# Patient Record
Sex: Female | Born: 1990 | Race: White | Hispanic: No | Marital: Single | Smoking: Current every day smoker
Health system: Southern US, Community
[De-identification: ages and names within clinical notes are randomized; demographics above are authoritative.]

## PROBLEM LIST (undated history)

## (undated) DIAGNOSIS — F329 Major depressive disorder, single episode, unspecified: Secondary | ICD-10-CM

## (undated) DIAGNOSIS — F32A Depression, unspecified: Secondary | ICD-10-CM

## (undated) DIAGNOSIS — F419 Anxiety disorder, unspecified: Secondary | ICD-10-CM

---
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
## 2019-08-17 ENCOUNTER — Inpatient Hospital Stay (HOSPITAL_COMMUNITY)
Admission: AD | Admit: 2019-08-17 | Discharge: 2019-08-21 | DRG: 881 | Disposition: A | Discharge status: Home / Self Care | Payer: Medicaid Other | Source: Intra-hospital | Attending: Psychiatry | Admitting: Psychiatry

## 2019-08-17 ENCOUNTER — Encounter (HOSPITAL_COMMUNITY): Payer: Self-pay | Admitting: Behavioral Health

## 2019-08-17 ENCOUNTER — Other Ambulatory Visit: Payer: Self-pay

## 2019-08-17 DIAGNOSIS — E0590 Thyrotoxicosis, unspecified without thyrotoxic crisis or storm: Secondary | ICD-10-CM

## 2019-08-17 DIAGNOSIS — G4700 Insomnia, unspecified: Secondary | ICD-10-CM | POA: Diagnosis present

## 2019-08-17 DIAGNOSIS — F332 Major depressive disorder, recurrent severe without psychotic features: Secondary | ICD-10-CM | POA: Diagnosis not present

## 2019-08-17 DIAGNOSIS — F419 Anxiety disorder, unspecified: Secondary | ICD-10-CM | POA: Diagnosis present

## 2019-08-17 DIAGNOSIS — F1520 Other stimulant dependence, uncomplicated: Secondary | ICD-10-CM | POA: Diagnosis not present

## 2019-08-17 DIAGNOSIS — F1010 Alcohol abuse, uncomplicated: Secondary | ICD-10-CM | POA: Diagnosis present

## 2019-08-17 DIAGNOSIS — F1994 Other psychoactive substance use, unspecified with psychoactive substance-induced mood disorder: Secondary | ICD-10-CM

## 2019-08-17 DIAGNOSIS — F329 Major depressive disorder, single episode, unspecified: Principal | ICD-10-CM | POA: Diagnosis present

## 2019-08-17 DIAGNOSIS — R899 Unspecified abnormal finding in specimens from other organs, systems and tissues: Secondary | ICD-10-CM

## 2019-08-17 DIAGNOSIS — F17210 Nicotine dependence, cigarettes, uncomplicated: Secondary | ICD-10-CM | POA: Diagnosis present

## 2019-08-17 HISTORY — DX: Anxiety disorder, unspecified: F41.9

## 2019-08-17 HISTORY — DX: Depression, unspecified: F32.A

## 2019-08-17 MED ORDER — MAGNESIUM HYDROXIDE 400 MG/5ML PO SUSP: 30.0000 mL | Freq: Every day | ORAL | Status: DC | PRN

## 2019-08-17 MED ORDER — ACETAMINOPHEN 325 MG PO TABS
650.0000 mg | ORAL_TABLET | Freq: Four times a day (QID) | ORAL | Status: DC | PRN
  Administered 2019-08-20: 08:00:00 650 mg via ORAL
  Filled 2019-08-17: qty 2

## 2019-08-17 MED ORDER — TRAZODONE HCL 50 MG PO TABS
50.0000 mg | ORAL_TABLET | Freq: Every evening | ORAL | Status: DC | PRN
  Administered 2019-08-17 – 2019-08-20 (×3): 50 mg via ORAL
  Filled 2019-08-17 (×3): qty 1

## 2019-08-17 MED ORDER — HYDROXYZINE HCL 25 MG PO TABS
25.0000 mg | ORAL_TABLET | Freq: Four times a day (QID) | ORAL | Status: DC | PRN
  Administered 2019-08-17: 22:00:00 25 mg via ORAL
  Filled 2019-08-17: qty 1

## 2019-08-17 MED ORDER — ALUM & MAG HYDROXIDE-SIMETH 200-200-20 MG/5ML PO SUSP: 30.0000 mL | ORAL | Status: DC | PRN

## 2019-08-17 NOTE — Tx Team (Signed)
Initial Treatment Plan 08/17/2019 6:30 PM Malaak Stach [id number]    PATIENT STRESSORS: Financial difficulties Legal issue Loss of employment Marital or family conflict   PATIENT STRENGTHS: Ability for insight Average or above average intelligence Communication skills   PATIENT IDENTIFIED PROBLEMS: depression  anxiety  Substance abuse  Financial difficulties               DISCHARGE CRITERIA:  Ability to meet basic life and health needs Improved stabilization in mood, thinking, and/or behavior Motivation to continue treatment in a less acute level of care Verbal commitment to aftercare and medication compliance  PRELIMINARY DISCHARGE PLAN: Outpatient therapy Participate in family therapy Return to previous living arrangement  PATIENT/FAMILY INVOLVEMENT: This treatment plan has been presented to and reviewed with the patient, Norina Buzzard.  The patient has been given the opportunity to ask questions and make suggestions.  Ronelle Nigh, RN 08/17/2019, 6:30 PM

## 2019-08-17 NOTE — Progress Notes (Signed)
Psychoeducational Group Note  Date:  08/17/2019 Time:2030  Group Topic/Focus:  wrap up group  Participation Level: Did Not Attend  Participation Quality:  Not Applicable  Affect:  Not Applicable  Cognitive:  Not Applicable  Insight:  Not Applicable  Engagement in Group: Not Applicable  Additional Comments:  Pt was notified that group was beginning but remained in her room, showered, and finished signing admission papers.   Erica Lee 08/17/2019, 9:16 PM
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
## 2019-08-18 DIAGNOSIS — F332 Major depressive disorder, recurrent severe without psychotic features: Secondary | ICD-10-CM

## 2019-08-18 DIAGNOSIS — F1994 Other psychoactive substance use, unspecified with psychoactive substance-induced mood disorder: Secondary | ICD-10-CM

## 2019-08-18 DIAGNOSIS — F1520 Other stimulant dependence, uncomplicated: Secondary | ICD-10-CM

## 2019-08-18 LAB — TSH: TSH: 0.159 u[IU]/mL — ABNORMAL LOW (ref 0.350–4.500)

## 2019-08-18 MED ORDER — ADULT MULTIVITAMIN W/MINERALS CH
1.0000 | ORAL_TABLET | Freq: Every day | ORAL | Status: DC
  Administered 2019-08-18 – 2019-08-21 (×4): 1 via ORAL
  Filled 2019-08-18 (×7): qty 1

## 2019-08-18 MED ORDER — ENSURE ENLIVE PO LIQD
237.0000 mL | Freq: Two times a day (BID) | ORAL | Status: DC
  Administered 2019-08-18 – 2019-08-21 (×4): 237 mL via ORAL

## 2019-08-18 MED ORDER — HYDROXYZINE HCL 25 MG PO TABS
25.0000 mg | ORAL_TABLET | Freq: Four times a day (QID) | ORAL | Status: DC | PRN
  Administered 2019-08-18 – 2019-08-20 (×3): 25 mg via ORAL
  Filled 2019-08-18 (×3): qty 1

## 2019-08-18 MED ORDER — ONDANSETRON 4 MG PO TBDP
4.0000 mg | ORAL_TABLET | Freq: Four times a day (QID) | ORAL | Status: DC | PRN
Start: 2019-08-18 — End: 2019-08-18

## 2019-08-18 MED ORDER — LORAZEPAM 1 MG PO TABS
1.0000 mg | ORAL_TABLET | Freq: Four times a day (QID) | ORAL | Status: DC | PRN
  Filled 2019-08-18: qty 1

## 2019-08-18 MED ORDER — SERTRALINE HCL 50 MG PO TABS
50.0000 mg | ORAL_TABLET | Freq: Every day | ORAL | Status: DC
  Administered 2019-08-18 – 2019-08-21 (×4): 50 mg via ORAL
  Filled 2019-08-18 (×7): qty 1

## 2019-08-18 MED ORDER — LOPERAMIDE HCL 2 MG PO CAPS: 2.0000 mg | ORAL_CAPSULE | ORAL | Status: DC | PRN

## 2019-08-18 MED ORDER — VITAMIN B-1 100 MG PO TABS
100.0000 mg | ORAL_TABLET | Freq: Every day | ORAL | Status: DC
  Administered 2019-08-19 – 2019-08-21 (×3): 100 mg via ORAL
  Filled 2019-08-18 (×6): qty 1

## 2019-08-18 MED ORDER — THIAMINE HCL 100 MG/ML IJ SOLN
100.0000 mg | Freq: Once | INTRAMUSCULAR | Status: AC
  Administered 2019-08-18: 100 mg via INTRAMUSCULAR
  Filled 2019-08-18: qty 2

## 2019-08-18 NOTE — Progress Notes (Signed)
Patient signed 72 hr request for discharge at 1505 today

## 2019-08-18 NOTE — [facility] Group Notes (Signed)
[facility] LCSW Group Therapy Note  Date/Time:  08/18/2019  10:00AM-11:00PM  Type of Therapy and Topic:  Group Therapy:  Music's Effect on Depression and Anxiety  Participation Level:  None   Description of Group: In this process group, members discussed what types of music trigger them to worsening mental health symptoms and what they can do about this in the future.  For instance, we discussed what to do when riding in a friend's car and a song harmful to the patient starts playing.  We also discussed how music can be used as a tool to help with wellness and recovery in various ways including managing depression and anxiety as well as encouraging healthy sleep habits and avoiding relapse into old negative behaviors such as drinking, self-harming, or staying in bed all day.  Five songs were played as examples, including "Tell Your Heart To Beat Again," "Inward Journey," "Stand," "I Am Enough," and "The Fight Song."  Comments were elicited which showed the group to be in agreement that the songs were quite relatable and have the potential to help them outside of the hospital setting.  Therapeutic Goals: 1. Patients will be introduced to several specific songs that can relate to self-image and self-love in a helpful way. 2. Patients will explore the impact of different pieces of music on their feelings, i.e. uplifting, triggering, etc. 3. Patients will discuss how to use this self-knowledge to assist in recovery.  Summary of Patient Progress:  At the beginning of group, patient refused to answer questions, and she never volunteered any comments later in group, although she did stay present for the entirety of group.  Therapeutic Modalities: Solution Focused Brief Therapy Activity   Selmer Dominion, LCSW

## 2019-08-18 NOTE — Progress Notes (Signed)
D.  Pt continues to be very anxious and agitated about being here.  Pt did not attend evening wrap up group, observed tearful on phone with family.  Pt did agree to sign admission paperwork.  Pt in dayroom or on phone until she requested her bedtime medications and went to bed.  Pt denies SI/HI/AVH at this time.  A.  Support and encouragement offered, medication given as ordered  R.  Pt remains safe on the unit, will continue to monitor.

## 2019-08-18 NOTE — Progress Notes (Signed)
NUTRITION ASSESSMENT  Pt identified as at risk on the Malnutrition Screen Tool  INTERVENTION: 1. Supplements: Ensure Enlive po BID, each supplement provides 350 kcal and 20 grams of protein  NUTRITION DIAGNOSIS: Unintentional weight loss related to sub-optimal intake as evidenced by pt report.   Goal: Pt to meet >/= 90% of their estimated nutrition needs.  Monitor:  PO intake  Assessment:  Pt admitted for depression and substance abuse (meth, ETOH). Pt reports drinking 4-6 beers on the weekends. Per weight records in care everywhere, pt has lost 18 lbs since 8/18 (11% wt loss x 3 months, significant for time frame). Will order Ensure supplements.  Height: Ht Readings from Last 1 Encounters:  08/17/19 5\' 2"  (1.575 m)    Weight: Wt Readings from Last 1 Encounters:  08/17/19 64.1 kg    Weight Hx: Wt Readings from Last 10 Encounters:  08/17/19 64.1 kg    BMI:  Body mass index is 25.84 kg/m. Pt meets criteria for overweight based on current BMI.  Estimated Nutritional Needs: Kcal: 25-30 kcal/kg Protein: > 1 gram protein/kg Fluid: 1 ml/kcal  Diet Order:  Diet Order    None     Pt is also offered choice of unit snacks mid-morning and mid-afternoon.  Pt is eating as desired.   Lab results and medications reviewed.   Clayton Bibles, MS, RD, LDN Inpatient Clinical Dietitian Pager: [phone] After Hours Pager: [phone]

## 2019-08-18 NOTE — Progress Notes (Signed)
D. Pt presents as depressed, anxious and mildly  Irritable. Pt observed attending group led by SW.Marland Kitchen Per pt's self inventory, pt rated her depression, hopelessness and anxiety all 10's. Pt writes that her goal today is "go home to my house with my kids", and writes that she will "stay clean, get a job, I don't know", to help her meet her goal.  Pt currently denies SI/HI and AVH  A. Labs and vitals monitored. Pt supported emotionally and encouraged to express concerns and ask questions.   R. Pt remains safe with 15 minute checks. Will continue POC.

## 2019-08-18 NOTE — [facility] Suicide Risk Assessment (Signed)
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  World Pharmacologist [facility]). Score between 0-7:  no or low risk or alcohol related problems. Score between 8-15:  moderate risk of alcohol related problems. Score between 16-19:  high risk of alcohol related problems. Score 20 or above:  warrants further diagnostic evaluation for alcohol dependence and treatment.   CLINICAL FACTORS:  28 year old female, presented to [facility] ED via police.  Presents as fair historian.  States she has not been "feeling right" recently and endorses worsening depression, neurovegetative symptoms.  Denies suicidal ideations.  No psychotic symptoms are noted or reported.  Reports recent explosive behaviors (recently broke a mirror, destroyed a TV at home) which she states occurred in the context of explosive anger related to relationship stress/argument.  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  She also reports drinking several times a week up to 6 high alcohol content beers per episode.  Currently does not present with symptoms of alcohol withdrawal and her vitals are stable at this time.   Psychiatric Specialty Exam: Physical Exam  ROS  Blood pressure 118/60, pulse 72, temperature  97.8 F (36.6 C), temperature source Oral, resp. rate 16, height 5\' 2"  (1.575 m), weight 64.1 kg, SpO2 100 %.Body mass index is 25.84 kg/m.  See admit note MSE   COGNITIVE FEATURES THAT CONTRIBUTE TO RISK:  Closed-mindedness and Loss of executive function    SUICIDE RISK:   Moderate:  Frequent suicidal ideation with limited intensity, and duration, some specificity in terms of plans, no associated intent, good self-control, limited dysphoria/symptomatology, some risk factors present, and identifiable protective factors, including available and accessible social support.  PLAN OF CARE: Patient will be admitted to inpatient psychiatric unit for stabilization and safety. Will provide and encourage milieu participation. Provide medication management and maked adjustments as needed.  Will also provide medication management to address alcohol withdrawal if needed -will follow daily.    I certify that inpatient services furnished can reasonably be expected to improve the patient's condition.   Jenne Campus, MD 08/18/2019, 3:20 PM

## 2019-08-18 NOTE — Progress Notes (Signed)
Felt NOVEL CORONAVIRUS (COVID-19) DAILY CHECK-OFF SYMPTOMS - answer yes or no to each - every day NO YES  Have you had a fever in the past 24 hours?  . Fever (Temp > 37.80C / 100F) X   Have you had any of these symptoms in the past 24 hours? . New Cough .  Sore Throat  .  Shortness of Breath .  Difficulty Breathing .  Unexplained Body Aches   X   Have you had any one of these symptoms in the past 24 hours not related to allergies?   . Runny Nose .  Nasal Congestion .  Sneezing   X   If you have had runny nose, nasal congestion, sneezing in the past 24 hours, has it worsened?  X   EXPOSURES - check yes or no X   Have you traveled outside the state in the past 14 days?  X   Have you been in contact with someone with a confirmed diagnosis of COVID-19 or PUI in the past 14 days without wearing appropriate PPE?  X   Have you been living in the same home as a person with confirmed diagnosis of COVID-19 or a PUI (household contact)?    X   Have you been diagnosed with COVID-19?    X              What to do next: Answered NO to all: Answered YES to anything:   Proceed with unit schedule Follow the [facility] Inpatient Flowsheet.   

## 2019-08-18 NOTE — H&P (Signed)
Psychiatric Admission Assessment Adult  Patient Identification: Erica Lee MRN:  [id number] Date of Evaluation:  08/18/2019 Chief Complaint: " I guess I told my mother I use meth" Principal Diagnosis: MDD versus Substance Induced Mood Disorder, Methamphetamine/ Alcohol Use Disorder Diagnosis: Endorses depression and some neuro-vegetative symptoms as below.    History of Present Illness:  28 year old female, presented to [facility] ED via police. States she told her parents to call the police " because I was not feeling right".  Presents as fair historian, vaguely irritable during session. Reports methamphetamine use disorder and states she has been using 2-3 x per week. Has also been drinking on most days of week, up to 6 ( high alcohol content ) beers per episode. She also describes significant stressors in addition to substance/alcohol abuse:financial difficulties forcing her to ask family members for money, " so it makes me feel like a loser ". Also,  her youngest daughter required psychiatric admission several weeks ago, which has caused her to feel she failed her as a mother , " I feel it was  my fault " Endorses depression, which she states she does not think is directly related to methamphetamine use, and explains that has significantly cut down on frequency and amount use. States " I am depressed even when I don't use it ". Describes some neuro-vegetative symptoms as below. Denies having suicidal ideations . Denies psychotic symptoms, and currently does not present paranoid or internally preoccupied, no delusions are expressed . States she recently broke a Ship brokermirror and a TV at home. Denies suicidal or self injurious intent, states she was angry with her boyfriend.  No current symptoms of WDL- no tremors, no diaphoresis, no restlessness or agitation. BP 118/60, pulse 72.  Associated Signs/Symptoms: Depression Symptoms:  depressed mood, anhedonia, hypersomnia, loss of  energy/fatigue, decreased appetite, (Hypo) Manic Symptoms:  irritability Anxiety Symptoms:  Reports chronic but worsening anxiety, states she worries " about everything" Psychotic Symptoms:  Denies hallucinations, no delusions are currently expressed  PTSD Symptoms: States " I have been through a lot of sh..." but does not elaborate, reports she does have intrusive memories and " thinks about it a lot", denies nightmares or other PTSD symptoms at this time Total Time spent with patient: 45 minutes  Past Psychiatric History:  Denies prior psychiatric admissions. Reports history of chronic depression. Denies history of suicide attempts, denies history of self cutting or of self injurious behaviors. 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   Is the patient at risk to self? Yes.    Has the patient been a risk to self in the past 6 months? No.  Has the patient been a risk to self within the distant past? No.  Is the patient a risk to others? No.  Has the patient been a risk to others in the past 6 months? No.  Has the patient been a risk to others within the distant past? No.   Prior Inpatient Therapy: Prior Inpatient Therapy: No Prior Outpatient Therapy: Prior Outpatient Therapy: No Does patient have an ACCT team?: No Does patient have Intensive In-House Services?  : No Does patient have [facility] services? : No Does patient have P4CC services?: No  Alcohol Screening: 1. How often do you have a drink containing  alcohol?: 2 to 4 times a month 2. How many drinks containing alcohol do you have on a typical day when you are drinking?: 3 or 4 3. How often do you have six or more drinks on one occasion?: Less than monthly AUDIT-C  Score: 4 4. How often during the last year have you found that you were not able to stop drinking once you had started?: Less than monthly 5. How often during the last year have you failed to do what was normally expected from you becasue of drinking?: Less than monthly 6. How often during the last year have you needed a first drink in the morning to get yourself going after a heavy drinking session?: Less than monthly 7. How often during the last year have you had a feeling of guilt of remorse after drinking?: Monthly 8. How often during the last year have you been unable to remember what happened the night before because you had been drinking?: Less than monthly 9. Have you or someone else been injured as a result of your drinking?: No 10. Has a relative or friend or a doctor or another health worker been concerned about your drinking or suggested you cut down?: No Alcohol Use Disorder Identification Test Final Score (AUDIT): 10 Substance Abuse History in the last 12 months:  Reports history of methamphetamine use disorder, has been using 2-3 x week,has also been drinking 4-6 high alcohol content beer on most days of week. States she last drank on Thanksgiving Day.  Consequences of Substance Abuse: Denies history of WDL seizures, denies history of blackouts, denies history of DUIs. 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  Previous Psychotropic Medications: was not taking any medications prior to admission. Reports she was prescribed Prozac 3-4 years ago, but was not well tolerated  Psychological Evaluations: No  Past Medical History: Denies medical illnesses. NKDA.  Past Medical History:  Diagnosis Date  . Anxiety   . Depression    History reviewed. No pertinent surgical history. Family History: Parents alive , live together, has one brother  Family Psychiatric  History: Describes  history of substance abuse in family members and states " I think everybody has Bipolar  Disorder". No suicides in family .  Tobacco Screening:  smokes 1 PPD Social History: 28, single, lives with boyfriend,  has 4 children ( oldest is 19, youngest is 2), who are currently with patient's mother and boyfriend. Unemployed. Social History   Substance and Sexual Activity  Alcohol Use Yes   Comment: 4-6 cans of beer on weekends     Social History   Substance and Sexual Activity  Drug Use Yes  . Types: Methamphetamines, Marijuana   Comment: patient is evasive concerning use of methamphetamines    Additional Social History: Marital status: Single    Pain Medications: see MAR Prescriptions: See MAR Over the Counter: see MAR History of alcohol / drug use?: Yes Longest period of sobriety (when/how long): unknown Negative Consequences of Use: Financial, Personal relationships, Work / Programmer, multimedia Name of Substance 1: methamphetamine 1 - Age of First Use: UTA 1 - Amount (size/oz): UTA 1 - Frequency: Denies current use, but UDS positive for methamphetamine 1 - Duration: UTA 1 - Last Use / Amount: UTA Name of Substance 2: Marijuana 2 - Age of First Use: UTA 2 - Amount (size/oz): UTA 2 - Frequency: UTA 2 - Duration: UTA 2 - Last Use / Amount: UTA Name of Substance 3: alcohol 3 - Age of First Use: UTA 3 -  Amount (size/oz): 4-6 cans of beer 3 - Frequency: weekends 3 - Duration: UTA 3 - Last Use / Amount: UTA              Allergies:  No Known Allergies Lab Results: No results found for this or any previous visit (from the past 48 hour(s)).  Blood Alcohol level:  No results found for: [facility]  Metabolic Disorder Labs:  No results found for: HGBA1C, MPG No results found for: PROLACTIN No results found for: CHOL, TRIG, HDL, CHOLHDL, VLDL, LDLCALC  Current Medications: Current Facility-Administered Medications  Medication Dose Route Frequency Provider Last Rate Last Dose  . acetaminophen (TYLENOL) tablet 650 mg  650 mg Oral Q6H PRN Derrill Center, NP      . alum & mag  hydroxide-simeth (MAALOX/MYLANTA) 200-200-20 MG/5ML suspension 30 mL  30 mL Oral Q4H PRN Derrill Center, NP      . hydrOXYzine (ATARAX/VISTARIL) tablet 25 mg  25 mg Oral Q6H PRN Lindon Romp A, NP   25 mg at 08/17/19 2148  . magnesium hydroxide (MILK OF MAGNESIA) suspension 30 mL  30 mL Oral Daily PRN Derrill Center, NP      . traZODone (DESYREL) tablet 50 mg  50 mg Oral QHS PRN Derrill Center, NP   50 mg at 08/17/19 2148   PTA Medications: No medications prior to admission.    Musculoskeletal: Strength & Muscle Tone: within normal limits Gait & Station: normal Patient leans: N/A  Psychiatric Specialty Exam: Physical Exam  Review of Systems  Constitutional: Negative.  Negative for chills and fever.  HENT: Negative.   Eyes: Negative.   Respiratory: Negative for cough.   Cardiovascular: Negative for chest pain.  Gastrointestinal: Negative for diarrhea, nausea and vomiting.  Genitourinary: Negative.   Musculoskeletal: Negative for myalgias.  Skin: Negative.  Negative for rash.  Neurological: Negative.  Negative for seizures and headaches.  Endo/Heme/Allergies: Negative.   Psychiatric/Behavioral: Positive for depression and substance abuse.    Blood pressure 118/60, pulse 72, temperature 97.8 F (36.6 C), temperature source Oral, resp. rate 16, height 5\' 2"  (1.575 m), weight 64.1 kg, SpO2 100 %.Body mass index is 25.84 kg/m.  General Appearance: Fairly Groomed  Eye Contact:  Fair  Speech:  Normal Rate  Volume:  Normal  Mood:  Depressed and Dysphoric  Affect:  congruent, constricted and vaguely irritable  Thought Process:  Linear  Orientation:  Other:  fully alert and attentive  Thought Content:  no hallucinations, no delusions, not internally preoccupied   Suicidal Thoughts:  No denies suicidal or self injurious ideations and states " I would never hurt myself, I have my kids to think of "  Homicidal Thoughts:  No  Memory:  recent and remote grossly intact   Judgement:   Fair  Insight:  Fair  Psychomotor Activity:  Normal currently not presenting with tremors or diaphoresis. No restlessness or psychomotor agitation noted at this time  Concentration:  Concentration: Good and Attention Span: Good  Recall:  Good  Fund of Knowledge:  Good  Language:  Good  Akathisia:  Negative  Handed:  Right  AIMS (if indicated):     Assets:  Communication Skills Desire for Improvement Resilience  ADL's:  Intact  Cognition:  WNL  Sleep:  Number of Hours: 5.75    Treatment Plan Summary: Daily contact with patient to assess and evaluate symptoms and progress in treatment, Medication management, Plan inpatient treatment  and medications as below  Observation Level/Precautions:  15 minute  checks  Laboratory:  I reviewed labs from [facility] ED - WBC, BMP unremarkable ( K+ 3.4)  UDS (+) for amphetamine, no BAL ,urine pregnancy negative, RPR and COVID negative.   Psychotherapy:  Milieu, group therapy   Medications:  We discussed options, agrees to antidepressant trial. Start Zoloft 50 mgrs QDAY. Will start Ativan PRN for alcohol WDL as per CIWA protocol if needed .  Consultations:  as needed   Discharge Concerns:  -  Estimated LOS: 3- 4 days  Other:     Physician Treatment Plan for Primary Diagnosis: Depression Long Term Goal(s): Improvement in symptoms so as ready for discharge  Short Term Goals: Ability to identify changes in lifestyle to reduce recurrence of condition will improve, Ability to verbalize feelings will improve, Ability to disclose and discuss suicidal ideas, Ability to demonstrate self-control will improve, Ability to identify and develop effective coping behaviors will improve, Ability to maintain clinical measurements within normal limits will improve and Compliance with prescribed medications will improve  Physician Treatment Plan for Secondary Diagnosis: Amphetamine, Alcohol Use Disorder, substance induced mood disorder Long Term Goal(s): Improvement in  symptoms so as ready for discharge  Short Term Goals: Ability to identify changes in lifestyle to reduce recurrence of condition will improve, Ability to verbalize feelings will improve, Ability to disclose and discuss suicidal ideas, Ability to demonstrate self-control will improve, Ability to identify and develop effective coping behaviors will improve, Ability to maintain clinical measurements within normal limits will improve and Compliance with prescribed medications will improve  I certify that inpatient services furnished can reasonably be expected to improve the patient's condition.    Craige Cotta, MD 11/29/202011:21 AM

## 2019-08-18 NOTE — [facility] Counselor (Signed)
Adult Comprehensive Assessment  Patient ID: Erica Lee, female   DOB: 1990/12/13, 28 y.o.   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 Family Relationships: "Everybody stresses me." Financial / Lack of resources (include bankruptcy): "All the time." Housing / Lack of housing: "It's not my house.  It's stressful all the time." Physical health (include injuries & life threatening diseases): Will not answer Social relationships: WIll not answer Substance abuse: "Always" Bereavement / Loss: "All the time"  Living/Environment/Situation:  Living Arrangements: Children, Spouse/significant other Living conditions (as described by patient or guardian): Good Who else lives in the home?: Boyfriend and 3 of her children (the other lives with her parents) How long has patient lived in current situation?: Does not know What is atmosphere in current home: Supportive  Family History:  Marital status: Long term relationship Long term relationship, how long?: 4 years What types of issues is patient dealing with in the relationship?: "Just that I don't have a job." Additional relationship information: During assessment, patient makes the statement that she should probably go live with parents so she can stay away from drugs. Does patient have children?: Yes How many children?: 4 How is patient's relationship with their children?: 2yo, 6yo, 8yo, and 13yo - does not answer what her relationship is like with them, although they all love her and she loves them.  Mother and father have all the children while patient is in the hospital.  Day to day, the  30yo lives with her parents and the three younger children live with her.  Childhood History:  By whom was/is the patient raised?: Both parents, Grandparents Description of patient's relationship with caregiver when they were a child: Does not remember, they worked all the time. Patient's description of current relationship with people who raised him/her: Good with both How were you disciplined when you got in trouble as a child/adolescent?: Grounded Does patient have siblings?: Yes Number of Siblings: 1 Description of patient's current relationship with siblings: Brother - good relationship Did patient suffer any verbal/emotional/physical/sexual abuse as a child?: No Did patient suffer from severe childhood neglect?: No Has patient ever been sexually abused/assaulted/raped as an adolescent or adult?: No Was the patient ever a victim of a crime or a disaster?: No Witnessed domestic violence?: No Has patient been effected by domestic violence as an adult?: No  Education:  Highest grade of school patient has completed: 9th grade Currently a student?: No Learning disability?: No  Employment/Work Situation:   Employment situation: Unemployed What is the longest time patient has a held a job?: 1 year 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 Did You Receive Any Psychiatric Treatment/Services While in Equities trader?: (No Financial planner) Are There Guns or Other Weapons in Your Home?: No  Financial Resources:   Surveyor, quantity resources: Support from parents / caregiver, Medicaid Does patient have a Lawyer or guardian?: No  Alcohol/Substance Abuse:   What has been your use of drugs/alcohol within the last 12 months?: Methamphetamines 1-2 times a month; Marijuana if somebody brings to her; Alcohol 4-6 cans of beer on weekends Alcohol/Substance Abuse Treatment Hx: Denies past history Has alcohol/substance abuse ever caused legal problems?:  No  Social Support  System:   Heritage manager System: Manufacturing engineer System: Boyfriend, whole family Type of faith/religion: None How does patient's faith help to cope with current illness?: N/A  Leisure/Recreation:   Leisure and Hobbies: "I don't know."  Strengths/Needs:   What is the patient's perception of their strengths?: Cooking, being a good momma Patient states they can use these personal strengths during their treatment to contribute to their recovery: Not sure Patient states these barriers may affect/interfere with their treatment: None Patient states these barriers may affect their return to the community: None Other important information patient would like considered in planning for their treatment: None  Discharge Plan:   Currently receiving community mental health services: No Patient states concerns and preferences for aftercare planning are: "I will do anything to get out of here and be with my young'uns." Patient states they will know when they are safe and ready for discharge when: "I wish I knew that answer."  Keeps insisting she wants to go home now. Does patient have access to transportation?: Yes Does patient have financial barriers related to discharge medications?: No Patient description of barriers related to discharge medications: Has support of loved ones and medicaid Will patient be returning to same living situation after discharge?: Yes(Going to her home, although she states that she should probably go stay with her parents where she cannot use drugs.)  Summary/Recommendations:   Summary and Recommendations (to be completed by the evaluator): Patient is a 28yo female admitted after law enforcement was called to the house, stating "I guess I'm depressed or have drug problems or something."  She reports using methamphetamines 1-2 times a month, marijuana "when it's brought to me," and alcohol on weekends.  Primary stressors include financial problems,  being unable to find a job, arguing with her mother about the welfare of her 4 children, and an upcoming court date for intentionally damaging someone's car.  She has recently broken a mirror with her hand, destroyed a television.  She reports having auditory/visual hallucinations only when under the influence of methamphetamines.  She denies ever having mental health or substance abuse treatment.  Mother states she often appears confused and has made suicidal threats.  Patient is hyperfocused and tearful about leaving hospital immediately.  She signed a 72-hour discharge request on 11/29 at 3:05pm.  Patient will benefit from crisis stabilization, medication evaluation, group therapy and psychoeducation, in addition to case management for discharge planning. At discharge it is recommended that Patient adhere to the established discharge plan and continue in treatment.  Maretta Los. 08/18/2019

## 2019-08-19 NOTE — Progress Notes (Signed)
Adult Psychoeducational Group Note  Date:  08/19/2019 Time:  9:04 AM  Group Topic/Focus:  Wrap-Up Group:   The focus of this group is to help patients review their daily goal of treatment and discuss progress on daily workbooks.  Participation Level:  Minimal  Participation Quality:  Appropriate  Affect:  Appropriate  Cognitive:  Appropriate  Insight: Appropriate  Engagement in Group:  Improving  Modes of Intervention:  Discussion  Additional Comments:  Pt attend wrap up group her day was a 9. Her goal for today was to communicate. She achieve her goal. Her coping skills calling her kids. No questions.  Lenice Llamas Long 08/19/2019, 9:04 AM

## 2019-08-19 NOTE — Progress Notes (Signed)
Psychoeducational Group Note  Date:  08/19/2019 Time:  2212  Group Topic/Focus:  Wrap-Up Group:   The focus of this group is to help patients review their daily goal of treatment and discuss progress on daily workbooks.  Participation Level: Did Not Attend  Participation Quality:  Not Applicable  Affect:  Not Applicable  Cognitive:  Not Applicable  Insight:  Not Applicable  Engagement in Group: Not Applicable  Additional Comments: The patient did not attend group this evening.   Sherley Mckenney S 08/19/2019, 10:12 PM

## 2019-08-19 NOTE — Progress Notes (Signed)
Recreation Therapy Notes  Date:  11.30.20 Time: 0930 Location: [address] Dayroom  Group Topic: Stress Management  Goal Area(s) Addresses:  Patient will identify positive stress management techniques. Patient will identify benefits of using stress management post d/c.  Intervention: Stress Management  Activity : Meditation.  LRT played a meditation that focused on making choices.  Patients were to listen and follow along as meditation played to engage in activity.  Education:  Stress Management, Discharge Planning.   Education Outcome: Acknowledges Education  Clinical Observations/Feedback:  Pt did not attend group.    Victorino Sparrow, LRT/CTRS         Victorino Sparrow A 08/19/2019 11:16 AM

## 2019-08-19 NOTE — [facility] Group Notes (Signed)
LCSW Group Therapy Note 08/19/2019 2:29 PM  Type of Therapy and Topic: Group Therapy: Overcoming Obstacles  Participation Level: Did Not Attend  Description of Group:  In this group patients will be encouraged to explore what they see as obstacles to their own wellness and recovery. They will be guided to discuss their thoughts, feelings, and behaviors related to these obstacles. The group will process together ways to cope with barriers, with attention given to specific choices patients can make. Each patient will be challenged to identify changes they are motivated to make in order to overcome their obstacles. This group will be process-oriented, with patients participating in exploration of their own experiences as well as giving and receiving support and challenge from other group members.  Therapeutic Goals: 1. Patient will identify personal and current obstacles as they relate to admission. 2. Patient will identify barriers that currently interfere with their wellness or overcoming obstacles.  3. Patient will identify feelings, thought process and behaviors related to these barriers. 4. Patient will identify two changes they are willing to make to overcome these obstacles:   Summary of Patient Progress  Invited, chose not to attend.    Therapeutic Modalities:  Cognitive Behavioral Therapy Solution Focused Therapy Motivational Interviewing Relapse Prevention Therapy   Theresa Duty Clinical Social Worker

## 2019-08-19 NOTE — [facility] Suicide Risk Assessment (Signed)
[place] INPATIENT:  Family/Significant Other Suicide Prevention Education  Suicide Prevention Education:  Contact Attempts: Vanity Larsson, mother, [phone]) has been identified by the patient as the family member/significant other with whom the patient will be residing, and identified as the person(s) who will aid the patient in the event of a mental health crisis.  With written consent from the patient, two attempts were made to provide suicide prevention education, prior to and/or following the patient's discharge.  We were unsuccessful in providing suicide prevention education.  A suicide education pamphlet was given to the patient to share with family/significant other.  Date and time of first attempt: 08/19/2019 at 3:10PM Date and time of second attempt: Second attempt is needed  CSW notes that HIPAA compliant voicemail was left.  Rozann Lesches 08/19/2019, 3:09 PM

## 2019-08-19 NOTE — Progress Notes (Signed)
D.  Pt denies complaints on approach, forwards very little.  Pt is slow to answer questions when asked directly.  Pt was positive for evening wrap up group, minimal interaction on the unit.  Pt denies SI/HI/AVH at this time.  A.  Support and encouragement offered, medication given as ordered  R.  Pt remains safe on the unit, will continue to monitor.

## 2019-08-19 NOTE — Progress Notes (Signed)
Spiritual care group on grief and loss facilitated by chaplain Jerene Pitch MDiv, [facility]  Group Goal:  Support / Education around grief and loss Members engage in facilitated group support and psycho-social education.  Group Description:  Following introductions and group rules, group members engaged in facilitated group dialog and support around topic of loss, with particular support around experiences of loss in their lives. Group Identified types of loss (relationships / self / things) and identified patterns, circumstances, and changes that precipitate losses. Reflected on thoughts / feelings around loss, normalized grief responses, and recognized variety in grief experience.   Group noted Worden's four tasks of grief in discussion.  Group drew on Adlerian / Rogerian, narrative, MI, Patient Progress:  Present throughout group.  Attentive to group conversation.  Did not engage in group discussion.

## 2019-08-19 NOTE — [facility] Group Notes (Signed)
LCSW Group Therapy Note 08/19/2019 2:30 PM  Type of Therapy and Topic: Group Therapy: Overcoming Obstacles  Participation Level: Did Not Attend  Description of Group:  In this group patients will be encouraged to explore what they see as obstacles to their own wellness and recovery. They will be guided to discuss their thoughts, feelings, and behaviors related to these obstacles. The group will process together ways to cope with barriers, with attention given to specific choices patients can make. Each patient will be challenged to identify changes they are motivated to make in order to overcome their obstacles. This group will be process-oriented, with patients participating in exploration of their own experiences as well as giving and receiving support and challenge from other group members.  Therapeutic Goals: 1. Patient will identify personal and current obstacles as they relate to admission. 2. Patient will identify barriers that currently interfere with their wellness or overcoming obstacles.  3. Patient will identify feelings, thought process and behaviors related to these barriers. 4. Patient will identify two changes they are willing to make to overcome these obstacles:   Summary of Patient Progress  Invited, chose not to attend.    Therapeutic Modalities:  Cognitive Behavioral Therapy Solution Focused Therapy Motivational Interviewing Relapse Prevention Therapy   Theresa Duty Clinical Social Worker

## 2019-08-19 NOTE — Progress Notes (Signed)
[facility] MD Progress Note  08/19/2019 6:48 PM Erica Lee  MRN:  [id number] Subjective: Patient reports "I guess I am feeling better".  Today states that she does feel that recent methamphetamine abuse was contributing to her depression and to her explosive behaviors. Currently she is denying suicidal ideations.  Denies psychotic symptoms and does not appear internally preoccupied, no delusions are expressed.  She presents future oriented and hopeful for discharge soon. At this time denies medication side effects. Objective: I have reviewed chart notes/discussed case with treatment team/met with patient. 28 year old female, presented to [facility] ED via police.  Presents as fair historian.  States she has not been "feeling right" recently and endorses worsening depression, neurovegetative symptoms.  Denies suicidal ideations.  No psychotic symptoms are noted or reported.  Reports recent explosive behaviors (recently broke a mirror, destroyed a TV at home) which she states occurred in the context of explosive anger related to relationship stress/argument.  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  She also reports drinking several times a week up to 6 high alcohol content beers per episode.  Currently does not present with symptoms of alcohol withdrawal and her vitals are stable at this time.  Today patient presents alert, attentive, remains vaguely depressed/dysphoric but describes improvement compared to admission and affect does present more reactive and less irritable as session progresses.  Denies hallucinations and does not appear internally preoccupied.  No delusions are expressed.  As above, today expresses that recent methamphetamine use was likely contributing to her mood and to her explosive behaviors. At this time she is denying suicidal ideations. Presents future oriented and is hopeful for discharge soon. No disruptive or agitated behaviors on unit, limited  group/milieu participation thus far.  Tends to isolate in room.  Vaguely guarded but cooperative on approach. Patient is not currently presenting with symptoms of alcohol withdrawal.  There are no tremors, no diaphoresis, no psychomotor restlessness.  BP stable at 107/73, pulse 81. 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 Diagnosis: Active Problems:   MDD (major depressive disorder)  Total Time spent with patient: 15 minutes  Past Psychiatric History:   Past Medical History:  Past Medical History:  Diagnosis Date  . Anxiety   . Depression    History reviewed. No pertinent surgical history. Family History: History reviewed. No pertinent family history. Family Psychiatric  History: Social History:  Social History   Substance and Sexual Activity  Alcohol Use Yes   Comment: 4-6 cans of beer on weekends     Social History   Substance and Sexual Activity  Drug Use Yes  . Types: Methamphetamines, Marijuana   Comment: patient is evasive concerning use of methamphetamines    Social History   Socioeconomic History  . Marital status: Single    Spouse name: Not on file  . Number of children: 4  . Years of education: Not on file  . Highest education level: Not on file  Occupational History  . Not on file  Social Needs  . Financial resource strain: Very hard  . Food insecurity    Worry: Not on file    Inability: Not on file  . Transportation needs    Medical: Not on file    Non-medical: Not on file  Tobacco Use  . Smoking status: Current Every Day Smoker    Packs/day: 0.50  . Smokeless tobacco: Never Used  Substance and Sexual Activity  . Alcohol  use: Yes    Comment: 4-6 cans of beer on weekends  . Drug use: Yes    Types: Methamphetamines, Marijuana    Comment: patient is evasive concerning use of methamphetamines  . Sexual activity: Not on file  Lifestyle  . Physical activity     Days per week: Not on file    Minutes per session: Not on file  . Stress: Not on file  Relationships  . Social Herbalist on phone: Not on file    Gets together: Not on file    Attends religious service: Not on file    Active member of club or organization: Not on file    Attends meetings of clubs or organizations: Not on file    Relationship status: Not on file  Other Topics Concern  . Not on file  Social History Narrative  . Not on file   Additional Social History:    Pain Medications: see MAR Prescriptions: See MAR Over the Counter: see MAR History of alcohol / drug use?: Yes Longest period of sobriety (when/how long): unknown Negative Consequences of Use: Financial, Personal relationships, Work / School Name of Substance 1: methamphetamine 1 - Age of First Use: UTA 1 - Amount (size/oz): UTA 1 - Frequency: Denies current use, but UDS positive for methamphetamine 1 - Duration: UTA 1 - Last Use / Amount: UTA Name of Substance 2: Marijuana 2 - Age of First Use: UTA 2 - Amount (size/oz): UTA 2 - Frequency: UTA 2 - Duration: UTA 2 - Last Use / Amount: UTA Name of Substance 3: alcohol 3 - Age of First Use: UTA 3 - Amount (size/oz): 4-6 cans of beer 3 - Frequency: weekends 3 - Duration: UTA 3 - Last Use / Amount: UTA    Sleep: Fair improving  Appetite:  Improving  Current Medications: Current Facility-Administered Medications  Medication Dose Route Frequency Provider Last Rate Last Dose  . acetaminophen (TYLENOL) tablet 650 mg  650 mg Oral Q6H PRN Derrill Center, NP      . alum & mag hydroxide-simeth (MAALOX/MYLANTA) 200-200-20 MG/5ML suspension 30 mL  30 mL Oral Q4H PRN Derrill Center, NP      . feeding supplement (ENSURE ENLIVE) (ENSURE ENLIVE) liquid 237 mL  237 mL Oral BID BM Shayan Bramhall, Myer Peer, MD   237 mL at 08/19/19 0748  . hydrOXYzine (ATARAX/VISTARIL) tablet 25 mg  25 mg Oral Q6H PRN Jaelan Rasheed, Myer Peer, MD   25 mg at 08/18/19 2108  . LORazepam  (ATIVAN) tablet 1 mg  1 mg Oral Q6H PRN Corby Villasenor A, MD      . magnesium hydroxide (MILK OF MAGNESIA) suspension 30 mL  30 mL Oral Daily PRN Derrill Center, NP      . multivitamin with minerals tablet 1 tablet  1 tablet Oral Daily Elese Rane, Myer Peer, MD   1 tablet at 08/19/19 0747  . sertraline (ZOLOFT) tablet 50 mg  50 mg Oral Daily Nikkia Devoss, Myer Peer, MD   50 mg at 08/19/19 0747  . thiamine (VITAMIN B-1) tablet 100 mg  100 mg Oral Daily Keivon Garden, Myer Peer, MD   100 mg at 08/19/19 0747  . traZODone (DESYREL) tablet 50 mg  50 mg Oral QHS PRN Derrill Center, NP   50 mg at 08/18/19 2108    Lab Results:  Results for orders placed or performed during the hospital encounter of 08/17/19 (from the past 48 hour(s))  TSH     Status:  Abnormal   Collection Time: 08/18/19  6:02 PM  Result Value Ref Range   TSH 0.159 (L) 0.350 - 4.500 uIU/mL    Comment: Performed by a 3rd Generation assay with a functional sensitivity of <=0.01 uIU/mL. Performed at [facility], [place] [address] [place], [place] [address]     Blood Alcohol level:  No results found for: [facility]  Metabolic Disorder Labs: No results found for: HGBA1C, MPG No results found for: PROLACTIN No results found for: CHOL, TRIG, HDL, CHOLHDL, VLDL, LDLCALC  Physical Findings: AIMS: Facial and Oral Movements Muscles of Facial Expression: None, normal Lips and Perioral Area: None, normal Jaw: None, normal Tongue: None, normal,Extremity Movements Upper (arms, wrists, hands, fingers): None, normal Lower (legs, knees, ankles, toes): None, normal, Trunk Movements Neck, shoulders, hips: None, normal, Overall Severity Severity of abnormal movements (highest score from questions above): None, normal Incapacitation due to abnormal movements: None, normal Patient's awareness of abnormal movements (rate only patient's report): No Awareness, Dental Status Current problems with teeth and/or dentures?: No Does patient usually wear  dentures?: No  CIWA:  CIWA-Ar Total: 1 COWS:  COWS Total Score: 0  Musculoskeletal: Strength & Muscle Tone: within normal limits no tremors, no diaphoresis, no psychomotor restlessness Gait & Station: normal Patient leans: N/A  Psychiatric Specialty Exam: Physical Exam  ROS denies chest pain, no shortness of breath, no cough, no vomiting  Blood pressure 107/73, pulse 81, temperature 97.8 F (36.6 C), temperature source Oral, resp. rate 16, height '5\' 2"'  (1.575 m), weight 64.1 kg, SpO2 100 %.Body mass index is 25.84 kg/m.  General Appearance: Fairly Groomed  Eye Contact:  Fair, improves during session  Speech:  Normal Rate  Volume:  Normal  Mood:  Reports feeling better, remains vaguely dysphoric/depressed but affect more reactive/less irritable  Affect:  Partially improved affect, more reactive  Thought Process:  Linear and Descriptions of Associations: Intact  Orientation:  Other:  Fully alert and attentive  Thought Content:  Denies hallucinations, no delusions expressed, not internally preoccupied  Suicidal Thoughts:  No currently denies any suicidal or self-injurious ideations, also denies homicidal or violent ideations  Homicidal Thoughts:  No  Memory:  Recent and remote grossly intact  Judgement:  Fair/improving  Insight:  Fair/improving  Psychomotor Activity:  Normal no psychomotor agitation or restlessness.  No distal tremors or diaphoresis  Concentration:  Concentration: Improving and Attention Span: Improving  Recall:  Good  Fund of Knowledge:  Good  Language:  Good  Akathisia:  Negative  Handed:  Right  AIMS (if indicated):     Assets:  Desire for Improvement Resilience  ADL's:  Intact  Cognition:  WNL  Sleep:  Number of Hours: 6.5   Assessment:  28 year old female, presented to [facility] ED via police.  Presents as fair historian.  States she has not been "feeling right" recently and endorses worsening depression, neurovegetative symptoms.  Denies suicidal  ideations.  No psychotic symptoms are noted or reported.  Reports recent explosive behaviors (recently broke a mirror, destroyed a TV at home) which she states occurred in the context of explosive anger related to relationship stress/argument.  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  She also reports drinking several times a week up to 6 high alcohol content beers per episode.  Currently does not present with symptoms of alcohol withdrawal and her vitals are stable at this time.  Patient reports some improvement compared to admission and currently denies suicidal ideations or self-injurious  thoughts.  Presents future oriented and hopeful for discharge soon.  Today presents vaguely dysphoric/guarded but exhibiting an improving range of affect.  No overt psychotic symptoms.  Limited milieu participation, no disruptive or agitated behaviors.  Today expresses increasing insight regarding likely relationship between methamphetamine abuse and mood disorder/explosive outbursts.  Which she had reported prior to admission.  Already medications well thus far.. TSH low  Treatment Plan Summary: Daily contact with patient to assess and evaluate symptoms and progress in treatment, Medication management, Plan Inpatient treatment and Medications as below Encourage group and milieu participation Continue Zoloft 50 mg daily for depression Continue trazodone 50 mg nightly as needed for insomnia Continue Vistaril 25 mg every 6 hours as needed for anxiety Continue Ativan 1 mg every 6 hours as needed for alcohol withdrawal symptoms as needed Treatment team working on disposition planning options We will order T3 and T4 to follow-up on low TSH Jenne Campus, MD 08/19/2019, 6:48 PM

## 2019-08-19 NOTE — Progress Notes (Signed)
[place] NOVEL CORONAVIRUS (COVID-19) DAILY CHECK-OFF SYMPTOMS - answer yes or no to each - every day NO YES  Have you had a fever in the past 24 hours?  . Fever (Temp > 37.80C / 100F) X   Have you had any of these symptoms in the past 24 hours? . New Cough .  Sore Throat  .  Shortness of Breath .  Difficulty Breathing .  Unexplained Body Aches   X   Have you had any one of these symptoms in the past 24 hours not related to allergies?   . Runny Nose .  Nasal Congestion .  Sneezing   X   If you have had runny nose, nasal congestion, sneezing in the past 24 hours, has it worsened?  X   EXPOSURES - check yes or no X   Have you traveled outside the state in the past 14 days?  X   Have you been in contact with someone with a confirmed diagnosis of COVID-19 or PUI in the past 14 days without wearing appropriate PPE?  X   Have you been living in the same home as a person with confirmed diagnosis of COVID-19 or a PUI (household contact)?    X   Have you been diagnosed with COVID-19?    X              What to do next: Answered NO to all: Answered YES to anything:   Proceed with unit schedule Follow the [facility] Inpatient Flowsheet.   

## 2019-08-19 NOTE — Tx Team (Signed)
Interdisciplinary Treatment and Diagnostic Plan Update  08/19/2019 Time of Session: 9:25am Erica Lee MRN: [id number]  Principal Diagnosis: <principal problem not specified>  Secondary Diagnoses: Active Problems:   MDD (major depressive disorder)   Current Medications:  Current Facility-Administered Medications  Medication Dose Route Frequency Provider Last Rate Last Dose  . acetaminophen (TYLENOL) tablet 650 mg  650 mg Oral Q6H PRN Derrill Center, NP      . alum & mag hydroxide-simeth (MAALOX/MYLANTA) 200-200-20 MG/5ML suspension 30 mL  30 mL Oral Q4H PRN Derrill Center, NP      . feeding supplement (ENSURE ENLIVE) (ENSURE ENLIVE) liquid 237 mL  237 mL Oral BID BM Cobos, Myer Peer, MD   237 mL at 08/19/19 0748  . hydrOXYzine (ATARAX/VISTARIL) tablet 25 mg  25 mg Oral Q6H PRN Cobos, Myer Peer, MD   25 mg at 08/18/19 2108  . LORazepam (ATIVAN) tablet 1 mg  1 mg Oral Q6H PRN Cobos, Fernando A, MD      . magnesium hydroxide (MILK OF MAGNESIA) suspension 30 mL  30 mL Oral Daily PRN Derrill Center, NP      . multivitamin with minerals tablet 1 tablet  1 tablet Oral Daily Cobos, Myer Peer, MD   1 tablet at 08/19/19 0747  . sertraline (ZOLOFT) tablet 50 mg  50 mg Oral Daily Cobos, Myer Peer, MD   50 mg at 08/19/19 0747  . thiamine (VITAMIN B-1) tablet 100 mg  100 mg Oral Daily Cobos, Myer Peer, MD   100 mg at 08/19/19 0747  . traZODone (DESYREL) tablet 50 mg  50 mg Oral QHS PRN Derrill Center, NP   50 mg at 08/18/19 2108   PTA Medications: No medications prior to admission.    Patient Stressors: Arts development officer issue Loss of employment Marital or family conflict  Patient Strengths: Ability for insight Average or above average intelligence Communication skills  Treatment Modalities: Medication Management, Group therapy, Case management,  1 to 1 session with clinician, Psychoeducation, Recreational therapy.   Physician Treatment Plan for Primary Diagnosis:  <principal problem not specified> Long Term Goal(s): Improvement in symptoms so as ready for discharge Improvement in symptoms so as ready for discharge   Short Term Goals: Ability to identify changes in lifestyle to reduce recurrence of condition will improve Ability to verbalize feelings will improve Ability to disclose and discuss suicidal ideas Ability to demonstrate self-control will improve Ability to identify and develop effective coping behaviors will improve Ability to maintain clinical measurements within normal limits will improve Compliance with prescribed medications will improve Ability to identify changes in lifestyle to reduce recurrence of condition will improve Ability to verbalize feelings will improve Ability to disclose and discuss suicidal ideas Ability to demonstrate self-control will improve Ability to identify and develop effective coping behaviors will improve Ability to maintain clinical measurements within normal limits will improve Compliance with prescribed medications will improve  Medication Management: Evaluate patient's response, side effects, and tolerance of medication regimen.  Therapeutic Interventions: 1 to 1 sessions, Unit Group sessions and Medication administration.  Evaluation of Outcomes: Not Met  Physician Treatment Plan for Secondary Diagnosis: Active Problems:   MDD (major depressive disorder)  Long Term Goal(s): Improvement in symptoms so as ready for discharge Improvement in symptoms so as ready for discharge   Short Term Goals: Ability to identify changes in lifestyle to reduce recurrence of condition will improve Ability to verbalize feelings will improve Ability to disclose and discuss suicidal ideas Ability  to demonstrate self-control will improve Ability to identify and develop effective coping behaviors will improve Ability to maintain clinical measurements within normal limits will improve Compliance with prescribed  medications will improve Ability to identify changes in lifestyle to reduce recurrence of condition will improve Ability to verbalize feelings will improve Ability to disclose and discuss suicidal ideas Ability to demonstrate self-control will improve Ability to identify and develop effective coping behaviors will improve Ability to maintain clinical measurements within normal limits will improve Compliance with prescribed medications will improve     Medication Management: Evaluate patient's response, side effects, and tolerance of medication regimen.  Therapeutic Interventions: 1 to 1 sessions, Unit Group sessions and Medication administration.  Evaluation of Outcomes: Not Met   RN Treatment Plan for Primary Diagnosis: <principal problem not specified> Long Term Goal(s): Knowledge of disease and therapeutic regimen to maintain health will improve  Short Term Goals: Ability to participate in decision making will improve, Ability to verbalize feelings will improve, Ability to disclose and discuss suicidal ideas, Ability to identify and develop effective coping behaviors will improve and Compliance with prescribed medications will improve  Medication Management: RN will administer medications as ordered by provider, will assess and evaluate patient's response and provide education to patient for prescribed medication. RN will report any adverse and/or side effects to prescribing provider.  Therapeutic Interventions: 1 on 1 counseling sessions, Psychoeducation, Medication administration, Evaluate responses to treatment, Monitor vital signs and CBGs as ordered, Perform/monitor CIWA, COWS, AIMS and Fall Risk screenings as ordered, Perform wound care treatments as ordered.  Evaluation of Outcomes: Not Met   LCSW Treatment Plan for Primary Diagnosis: <principal problem not specified> Long Term Goal(s): Safe transition to appropriate next level of care at discharge, Engage patient in  therapeutic group addressing interpersonal concerns.  Short Term Goals: Engage patient in aftercare planning with referrals and resources  Therapeutic Interventions: Assess for all discharge needs, 1 to 1 time with Social worker, Explore available resources and support systems, Assess for adequacy in community support network, Educate family and significant other(s) on suicide prevention, Complete Psychosocial Assessment, Interpersonal group therapy.  Evaluation of Outcomes: Not Met   Progress in Treatment: Attending groups: No. Participating in groups: No. Taking medication as prescribed: Yes. Toleration medication: Yes. Family/Significant other contact made: No, will contact:  the patient's mother Patient understands diagnosis: Yes. Discussing patient identified problems/goals with staff: Yes. Medical problems stabilized or resolved: Yes. Denies suicidal/homicidal ideation: No. Issues/concerns per patient self-inventory: No. Other:    New problem(s) identified: None   New Short Term/Long Term Goal(s):Detox, medication stabilization, elimination of SI thoughts, development of comprehensive mental wellness plan.    Patient Goals: "Just to get healthy. I want to go home"    Discharge Plan or Barriers: Patient recently admitted over the weekend. CSW will continue to follow and assess for appropriate referrals and possible discharge planning.    Reason for Continuation of Hospitalization: Anxiety Depression Medication stabilization  Estimated Length of Stay: 3-5 days   Attendees: Patient: Erica Lee  08/19/2019 10:36 AM  Physician: Dr. Neita Garnet, MD 08/19/2019 10:36 AM  Nursing: Rise Paganini.Raliegh Ip, RN 08/19/2019 10:36 AM  RN Care Manager: 08/19/2019 10:36 AM  Social Worker: Radonna Ricker, LCSW 08/19/2019 10:36 AM  Recreational Therapist:  08/19/2019 10:36 AM  Other: Harriett Sine, NP 08/19/2019 10:36 AM  Other:  08/19/2019 10:36 AM  Other: 08/19/2019 10:36 AM    Scribe for  Treatment Team: Marylee Floras, [place] 08/19/2019 10:36 AM

## 2019-08-20 ENCOUNTER — Inpatient Hospital Stay (HOSPITAL_COMMUNITY): Payer: Self-pay

## 2019-08-20 ENCOUNTER — Inpatient Hospital Stay (HOSPITAL_COMMUNITY): Payer: Medicaid Other

## 2019-08-20 DIAGNOSIS — E0590 Thyrotoxicosis, unspecified without thyrotoxic crisis or storm: Secondary | ICD-10-CM

## 2019-08-20 LAB — T4, FREE: Free T4: 0.8 ng/dL (ref 0.61–1.12)

## 2019-08-20 NOTE — Progress Notes (Addendum)
D:  Patient's self inventory sheet, patient has poor sleep, no sleep medication.  Fair appetite, low energy level, good concentration.  Rated depression and hopeless 1, denied anxiiety.  Denied withdrawals.  Denied SI thoughts.  Denied physical problems.  Denied physical pain.     Goal is know medicines, keep a goal (discharge),  Breathing, not depend on drugs or alcohol.  Know kids, real mom.  Does have discharge plans. A:  Medications administered per MD orders.  Emotional support  and encouragement given patients. R:  Denied SI and HI, contracts for safety.  Denied A/V hallucinations.  Safety maintained with 15 minute checks.

## 2019-08-20 NOTE — Progress Notes (Signed)
[facility] MD Progress Note  08/20/2019 10:30 AM Erica Lee  MRN:  [id number] Subjective: Patient is a 28 year old female admitted on 08/18/2019 who originally presented to the [facility] emergency department via police.  At that time she admitted to methamphetamine dependence, and had also been drinking alcohol.  She was transferred to our facility for evaluation and stabilization.  Objective: Patient is seen and examined.  Patient is a 28 year old female with the above-stated past psychiatric history who is seen in follow-up.  Nursing and nursing notes had suggested that the patient was requesting discharge.  I saw the patient, and she was not a very good historian, and she did not look well from a clinical standpoint.  She was very evasive to questions.  When we discussed whether or not she was interested in a residential program, or an outpatient intensive program.  She again was very evasive.  She denied any gross suicidal ideation, but was anxious and had poor eye contact.  When we discussed where she would go home she stated that initially she would go to her mother's, and I told her that I felt as though her mother had expressed interest in the patient going to a rehab program, and then she stated that she would probably go stay with a friend.  She was unable to tell me whether or not she would use drugs after discharge.  She denied any gross suicidal ideation.  Principal Problem: <principal problem not specified> Diagnosis: Active Problems:   MDD (major depressive disorder)  Total Time spent with patient: 20 minutes  Past Psychiatric History: See admission H&P  Past Medical History:  Past Medical History:  Diagnosis Date  . Anxiety   . Depression    History reviewed. No pertinent surgical history. Family History: History reviewed. No pertinent family history. Family Psychiatric  History: See admission H&P Social History:  Social History   Substance and Sexual Activity  Alcohol  Use Yes   Comment: 4-6 cans of beer on weekends     Social History   Substance and Sexual Activity  Drug Use Yes  . Types: Methamphetamines, Marijuana   Comment: patient is evasive concerning use of methamphetamines    Social History   Socioeconomic History  . Marital status: Single    Spouse name: Not on file  . Number of children: 4  . Years of education: Not on file  . Highest education level: Not on file  Occupational History  . Not on file  Social Needs  . Financial resource strain: Very hard  . Food insecurity    Worry: Not on file    Inability: Not on file  . Transportation needs    Medical: Not on file    Non-medical: Not on file  Tobacco Use  . Smoking status: Current Every Day Smoker    Packs/day: 0.50  . Smokeless tobacco: Never Used  Substance and Sexual Activity  . Alcohol use: Yes    Comment: 4-6 cans of beer on weekends  . Drug use: Yes    Types: Methamphetamines, Marijuana    Comment: patient is evasive concerning use of methamphetamines  . Sexual activity: Not on file  Lifestyle  . Physical activity    Days per week: Not on file    Minutes per session: Not on file  . Stress: Not on file  Relationships  . Social Musician on phone: Not on file    Gets together: Not on file    Attends religious  service: Not on file    Active member of club or organization: Not on file    Attends meetings of clubs or organizations: Not on file    Relationship status: Not on file  Other Topics Concern  . Not on file  Social History Narrative  . Not on file   Additional Social History:    Pain Medications: see MAR Prescriptions: See MAR Over the Counter: see MAR History of alcohol / drug use?: Yes Longest period of sobriety (when/how long): unknown Negative Consequences of Use: Financial, Personal relationships, Work / School Name of Substance 1: methamphetamine 1 - Age of First Use: UTA 1 - Amount (size/oz): UTA 1 - Frequency: Denies current  use, but UDS positive for methamphetamine 1 - Duration: UTA 1 - Last Use / Amount: UTA Name of Substance 2: Marijuana 2 - Age of First Use: UTA 2 - Amount (size/oz): UTA 2 - Frequency: UTA 2 - Duration: UTA 2 - Last Use / Amount: UTA Name of Substance 3: alcohol 3 - Age of First Use: UTA 3 - Amount (size/oz): 4-6 cans of beer 3 - Frequency: weekends 3 - Duration: UTA 3 - Last Use / Amount: UTA              Sleep: Good  Appetite:  Fair  Current Medications: Current Facility-Administered Medications  Medication Dose Route Frequency Provider Last Rate Last Dose  . acetaminophen (TYLENOL) tablet 650 mg  650 mg Oral Q6H PRN Oneta RackLewis, Tanika N, NP   650 mg at 08/20/19 0738  . alum & mag hydroxide-simeth (MAALOX/MYLANTA) 200-200-20 MG/5ML suspension 30 mL  30 mL Oral Q4H PRN Oneta RackLewis, Tanika N, NP      . feeding supplement (ENSURE ENLIVE) (ENSURE ENLIVE) liquid 237 mL  237 mL Oral BID BM Cobos, Rockey SituFernando A, MD   237 mL at 08/19/19 0748  . hydrOXYzine (ATARAX/VISTARIL) tablet 25 mg  25 mg Oral Q6H PRN Cobos, Rockey SituFernando A, MD   25 mg at 08/20/19 0738  . LORazepam (ATIVAN) tablet 1 mg  1 mg Oral Q6H PRN Cobos, Fernando A, MD      . magnesium hydroxide (MILK OF MAGNESIA) suspension 30 mL  30 mL Oral Daily PRN Oneta RackLewis, Tanika N, NP      . multivitamin with minerals tablet 1 tablet  1 tablet Oral Daily Cobos, Rockey SituFernando A, MD   1 tablet at 08/20/19 0735  . sertraline (ZOLOFT) tablet 50 mg  50 mg Oral Daily Cobos, Rockey SituFernando A, MD   50 mg at 08/20/19 0735  . thiamine (VITAMIN B-1) tablet 100 mg  100 mg Oral Daily Cobos, Rockey SituFernando A, MD   100 mg at 08/20/19 0736  . traZODone (DESYREL) tablet 50 mg  50 mg Oral QHS PRN Oneta RackLewis, Tanika N, NP   50 mg at 08/18/19 2108    Lab Results:  Results for orders placed or performed during the hospital encounter of 08/17/19 (from the past 48 hour(s))  TSH     Status: Abnormal   Collection Time: 08/18/19  6:02 PM  Result Value Ref Range   TSH 0.159 (L) 0.350 - 4.500  uIU/mL    Comment: Performed by a 3rd Generation assay with a functional sensitivity of <=0.01 uIU/mL. Performed at [facility], [address] [place], [place] [address]   T4, free     Status: None   Collection Time: 08/20/19  6:42 AM  Result Value Ref Range   Free T4 0.80 0.61 - 1.12 ng/dL  Comment: (NOTE) Biotin ingestion may interfere with free T4 tests. If the results are inconsistent with the TSH level, previous test results, or the clinical presentation, then consider biotin interference. If needed, order repeat testing after stopping biotin. Performed at [facility], [place] [address] [place], [place] [address]     Blood Alcohol level:  No results found for: [facility]  Metabolic Disorder Labs: No results found for: HGBA1C, MPG No results found for: PROLACTIN No results found for: CHOL, TRIG, HDL, CHOLHDL, VLDL, LDLCALC  Physical Findings: AIMS: Facial and Oral Movements Muscles of Facial Expression: None, normal Lips and Perioral Area: None, normal Jaw: None, normal Tongue: None, normal,Extremity Movements Upper (arms, wrists, hands, fingers): None, normal Lower (legs, knees, ankles, toes): None, normal, Trunk Movements Neck, shoulders, hips: None, normal, Overall Severity Severity of abnormal movements (highest score from questions above): None, normal Incapacitation due to abnormal movements: None, normal Patient's awareness of abnormal movements (rate only patient's report): No Awareness, Dental Status Current problems with teeth and/or dentures?: No Does patient usually wear dentures?: No  CIWA:  CIWA-Ar Total: 1 COWS:  COWS Total Score: 0  Musculoskeletal: Strength & Muscle Tone: within normal limits Gait & Station: normal Patient leans: N/A  Psychiatric Specialty Exam: Physical Exam  Nursing note and vitals reviewed. Constitutional: She is oriented to person, place, and time. She appears well-developed and well-nourished.  HENT:   Head: Normocephalic and atraumatic.  Respiratory: Effort normal.  Neurological: She is alert and oriented to person, place, and time.    ROS  Blood pressure (!) 82/61, pulse 69, temperature 98.3 F (36.8 C), resp. rate 18, height 5\' 2"  (1.575 m), weight 64.1 kg, SpO2 100 %.Body mass index is 25.84 kg/m.  General Appearance: Casual  Eye Contact:  Minimal  Speech:  Normal Rate  Volume:  Normal  Mood:  Anxious, Depressed and Dysphoric  Affect:  Congruent  Thought Process:  Coherent and Descriptions of Associations: Circumstantial  Orientation:  Full (Time, Place, and Person)  Thought Content:  Logical  Suicidal Thoughts:  No  Homicidal Thoughts:  No  Memory:  Immediate;   Fair Recent;   Fair Remote;   Fair  Judgement:  Impaired  Insight:  Lacking  Psychomotor Activity:  Increased  Concentration:  Concentration: Fair and Attention Span: Fair  Recall:  [employer] of Knowledge:  Fair  Language:  Good  Akathisia:  Negative  Handed:  Right  AIMS (if indicated):     Assets:  Desire for Improvement Resilience  ADL's:  Intact  Cognition:  WNL  Sleep:  Number of Hours: 6.25     Treatment Plan Summary: Daily contact with patient to assess and evaluate symptoms and progress in treatment, Medication management and Plan : Patient is seen and examined.  Patient is a 28 year old female with the above-stated past psychiatric history who is seen in follow-up.   Diagnosis: #1 amphetamine dependence, #2 alcohol use disorder, #3 substance-induced mood disorder  Patient is seen in follow-up.  There was some question about whether she would be discharged today.  I do not feel comfortable with this.  She seems to be very apprehensive and the little irritable.  Most likely secondary to withdrawal symptoms.  She is already on Zoloft 50 mg p.o. daily for anxiety and depression.  We will continue this.  No change in the lorazepam so far.  Her CIWA this morning was 1.  She slept 6.25 hours last  night.  If anything her blood pressure is  a little low at 82/61.  Hopefully she will begin to start making some progress, and have encouraged her to start developing a plan in association with social work for substance abuse treatment after discharge. 1.  Continue hydroxyzine 25 mg p.o. every 6 hours as needed anxiety. 2.  Continue lorazepam 1 mg p.o. every 6 hours as needed a CIWA greater than 10. 3.  Continue sertraline 50 mg p.o. daily for anxiety and depression. 4.  Continue thiamine 100 mg p.o. daily for nutritional supplementation. 5.  Continue trazodone 50 mg p.o. nightly as needed insomnia. 6.  It appears that the patient has hyperthyroidism, her TSH is 0.159.  Her T4 is 0.80 disposition planning-in progress.  The T4 is within normal limits.  Clearly she is not tachycardic at this point.  Adding beta-blockers might be complicated, so I will order a thyroid ultrasound to get a better idea of what may be going on in her thyroid. 7.  Disposition planning-in progress.  Antonieta Pert, MD 08/20/2019, 10:30 AM

## 2019-08-20 NOTE — Progress Notes (Signed)
   08/20/19 0018  Psych Admission Type (Psych Patients Only)  Admission Status Voluntary  Psychosocial Assessment  Patient Complaints Isolation  Eye Contact Brief  Facial Expression Anxious;Flat  Affect Appropriate to circumstance  Speech Soft;Slow  Interaction Assertive  Motor Activity Fidgety  Appearance/Hygiene Disheveled  Behavior Characteristics Cooperative  Mood Depressed  Thought Process  Coherency WDL  Content WDL  Delusions None reported or observed  Perception WDL  Hallucination None reported or observed  Judgment Poor  Confusion Mild  Danger to Self  Current suicidal ideation? Denies  Danger to Others  Danger to Others None reported or observed

## 2019-08-20 NOTE — Plan of Care (Signed)
Nurse discussed anxiety, depression with patient.  

## 2019-08-20 NOTE — Progress Notes (Signed)
This patient has been scheduled for US thyroid at [facility] at 3:00 p.m. [facility] has been called and asked nurse to call back about 2:30 p.m. to be transported.

## 2019-08-21 DIAGNOSIS — E0590 Thyrotoxicosis, unspecified without thyrotoxic crisis or storm: Secondary | ICD-10-CM

## 2019-08-21 DIAGNOSIS — F1994 Other psychoactive substance use, unspecified with psychoactive substance-induced mood disorder: Secondary | ICD-10-CM

## 2019-08-21 LAB — THYROGLOBULIN ANTIBODY: Thyroglobulin Antibody: <1 IU/mL (ref 0.0–0.9)

## 2019-08-21 LAB — T3, FREE: T3, Free: 2.5 pg/mL (ref 2.0–4.4)

## 2019-08-21 LAB — THYROID PEROXIDASE ANTIBODY: Thyroperoxidase Ab SerPl-aCnc: <9 IU/mL (ref 0–34)

## 2019-08-21 MED ORDER — SERTRALINE HCL 50 MG PO TABS: 50.0000 mg | ORAL_TABLET | Freq: Every day | ORAL | 0 refills | Status: DC

## 2019-08-21 MED ORDER — HYDROXYZINE HCL 25 MG PO TABS: 25.0000 mg | ORAL_TABLET | Freq: Four times a day (QID) | ORAL | 0 refills | Status: DC | PRN

## 2019-08-21 MED ORDER — TRAZODONE HCL 50 MG PO TABS: 50.0000 mg | ORAL_TABLET | Freq: Every evening | ORAL | 0 refills | Status: DC | PRN

## 2019-08-21 NOTE — Progress Notes (Signed)
Patient ID: Erica Lee, female   DOB: 05/08/1991, 28 y.o.   MRN: [id number]  D: Pt alert and oriented on the unit.   A: Education, support, and encouragement provided. Discharge summary, medications and follow up appointments reviewed with pt. Suicide prevention resources provided, including "My 3 App." Pt's belongings in locker # 8 returned and belongings sheet signed.  R: Pt denies SI/HI, A/VH, pain, or any concerns at this time. Pt ambulatory on and off unit. Pt discharged to lobby.

## 2019-08-21 NOTE — [facility] Suicide Risk Assessment (Signed)
[facility] Discharge Suicide Risk Assessment   Principal Problem: <principal problem not specified> Discharge Diagnoses: Active Problems:   MDD (major depressive disorder)   Total Time spent with patient: 15 minutes  Musculoskeletal: Strength & Muscle Tone: within normal limits Gait & Station: normal Patient leans: N/A  Psychiatric Specialty Exam: Review of Systems  All other systems reviewed and are negative.   Blood pressure (!) 116/92, pulse 85, temperature 98.3 F (36.8 C), resp. rate 18, height 5\' 2"  (1.575 m), weight 64.1 kg, SpO2 100 %.Body mass index is 25.84 kg/m.  General Appearance: Casual  Eye Contact::  Fair  Speech:  Normal Rate409  Volume:  Normal  Mood:  Anxious  Affect:  Congruent  Thought Process:  Coherent and Descriptions of Associations: Intact  Orientation:  Full (Time, Place, and Person)  Thought Content:  Logical  Suicidal Thoughts:  No  Homicidal Thoughts:  No  Memory:  Immediate;   Fair Recent;   Fair Remote;   Fair  Judgement:  Fair  Insight:  Fair  Psychomotor Activity:  Increased  Concentration:  Fair  Recall:  [employer] of Knowledge:Good  Language: Good  Akathisia:  Negative  Handed:  Right  AIMS (if indicated):     Assets:  Desire for Improvement Resilience  Sleep:  Number of Hours: 4.75  Cognition: WNL  ADL's:  Intact   Mental Status Per Nursing Assessment::   On Admission:  NA  Demographic Factors:  Caucasian, Low socioeconomic status and Unemployed  Loss Factors: Financial problems/change in socioeconomic status  Historical Factors: Impulsivity  Risk Reduction Factors:   Positive coping skills or problem solving skills  Continued Clinical Symptoms:  Depression:   Comorbid alcohol abuse/dependence Impulsivity Alcohol/Substance Abuse/Dependencies  Cognitive Features That Contribute To Risk:  None    Suicide Risk:  Minimal: No identifiable suicidal ideation.  Patients presenting with no risk factors but with morbid  ruminations; may be classified as minimal risk based on the severity of the depressive symptoms  Follow-up [place] Follow up on 08/22/2019.   Why: Your hospital follow appointment for medication management and therapy services is Thursday, 08/22/2019 at 9:30am. Please be sure to bring your Photo ID, SSN, any insurance information and proof of household income. For any questions, please call agency.  Contact information: [place] [address] [phone]           Plan Of Care/Follow-up recommendations:  Activity:  ad lib  Sharma Covert, MD 08/21/2019, 9:55 AM

## 2019-08-21 NOTE — Discharge Summary (Signed)
Physician Discharge Summary Note  Patient:  Erica Lee is an 28 y.o., female  MRN:  [id number]  DOB:  04-Sep-1991  Patient phone:  [phone] (home)   Patient address:   [place] [address],   Total Time spent with patient: Greater than 30 minutes  Date of Admission:  08/17/2019  Date of Discharge: 08-21-19  Reason for Admission: Worsening symptoms of depression.  Principal Problem: MDD (major depressive disorder)  Discharge Diagnoses: Principal Problem:   MDD (major depressive disorder) Active Problems:   Substance induced mood disorder ([place])   Hyperthyroidism  Past Psychiatric History: Major depressive disorder, Alcohol use disorder, Methamphetamine use disorder.  Past Medical History:  Past Medical History:  Diagnosis Date  . Anxiety   . Depression    History reviewed. No pertinent surgical history.  Family History: History reviewed. No pertinent family history.  Family Psychiatric  History: See H&P  Social History:  Social History   Substance and Sexual Activity  Alcohol Use Yes   Comment: 4-6 cans of beer on weekends     Social History   Substance and Sexual Activity  Drug Use Yes  . Types: Methamphetamines, Marijuana   Comment: patient is evasive concerning use of methamphetamines    Social History   Socioeconomic History  . Marital status: Single    Spouse name: Not on file  . Number of children: 4  . Years of education: Not on file  . Highest education level: Not on file  Occupational History  . Not on file  Social Needs  . Financial resource strain: Very hard  . Food insecurity    Worry: Not on file    Inability: Not on file  . Transportation needs    Medical: Not on file    Non-medical: Not on file  Tobacco Use  . Smoking status: Current Every Day Smoker    Packs/day: 0.50  . Smokeless tobacco: Never Used  Substance and Sexual Activity  . Alcohol use: Yes    Comment: 4-6 cans of beer on weekends  . Drug  use: Yes    Types: Methamphetamines, Marijuana    Comment: patient is evasive concerning use of methamphetamines  . Sexual activity: Not on file  Lifestyle  . Physical activity    Days per week: Not on file    Minutes per session: Not on file  . Stress: Not on file  Relationships  . Social Herbalist on phone: Not on file    Gets together: Not on file    Attends religious service: Not on file    Active member of club or organization: Not on file    Attends meetings of clubs or organizations: Not on file    Relationship status: Not on file  Other Topics Concern  . Not on file  Social History Narrative  . Not on file   Hospital Course: (Per Md's admission evaluation): 28 year old female, presented to [facility] ED via police. States she told her parents to call the police " because I was not feeling right".  Presents as fair historian, vaguely irritable during session. Reports methamphetamine use disorder and states she has been using 2-3 x per week. Has also been drinking on most days of week, up to 6 (high alcohol content) beers per episode. She also describes significant stressors in addition to substance/alcohol abuse:financial difficulties forcing her to ask family members for money, " so it makes me feel like a loser ". Also,  her youngest daughter required psychiatric admission several weeks ago, which has caused her to feel she failed her as a mother , " I feel it was  my fault". Endorses depression, which she states she does not think is directly related to methamphetamine use, and explains that has significantly cut down on frequency and amount use. States " I am depressed even when I don't use it ". Describes some neuro-vegetative symptoms as below.  After evaluation of his presenting symptoms as noted above, Erica Lee was recommended for mood stabilization treatments. The medication regimen for her presenting symptoms were discussed & with her consent initiated. She received,  stabilized & was discharged on the medications as listed below on her discharge medication lists. She was also enrolled & participated in the group counseling sessions being offered & held on this unit. She learned coping skills. She presented on this admission, no other pre-existing medical conditions that required treatment & monitoring. She tolerated her treatment regimen without any adverse effects or reactions reported.    During the course of her hospitalization, the 15-minute checks were adequate to ensure Erica Lee's safety.  Patient did not display any dangerous, violent or suicidal behavior on the unit. She interacted with patients & staff appropriately, participated appropriately in the group sessions/therapies. Her medications were addressed & adjusted to meet her needs. She was recommended for outpatient follow-up care & medication management upon discharge to assure her continuity of care.  At the time of this hospital discharge, patient is not reporting any acute suicidal/homicidal ideations. She feels more confident about her mental state. She currently denies any new issues or concerns. Education and supportive counseling provided throughout her hospital stay & upon discharge.   Today upon her discharge evaluation with the attending psychiatrist, Erica Lee Lee she is doing well. She denies any other specific concerns. She is sleeping well. Her appetite is good. She denies other physical complaints. She denies any AH/VH. She feels that her medications have been helpful & is in agreement to continue her current treatment regimen as recommended. She was able to engage in safety planning including plan to [facility] or contact emergency services if she feels unable to maintain her own safety or the safety of others. Pt had no further questions, comments, or concerns. [facility] in no apparent distress. Transportation per parents.   Physical Findings: AIMS: Facial  and Oral Movements Muscles of Facial Expression: None, normal Lips and Perioral Area: None, normal Jaw: None, normal Tongue: None, normal,Extremity Movements Upper (arms, wrists, hands, fingers): None, normal Lower (legs, knees, ankles, toes): None, normal, Trunk Movements Neck, shoulders, hips: None, normal, Overall Severity Severity of abnormal movements (highest score from questions above): None, normal Incapacitation due to abnormal movements: None, normal Patient's awareness of abnormal movements (rate only patient's report): No Awareness, Dental Status Current problems with teeth and/or dentures?: No Does patient usually wear dentures?: No  CIWA:  CIWA-Ar Total: 1 COWS:  COWS Total Score: 2  Musculoskeletal: Strength & Muscle Tone: within normal limits Gait & Station: normal Patient leans: N/A  Psychiatric Specialty Exam: Physical Exam  Constitutional: She is oriented to person, place, and time. She appears well-developed.  Neck: Normal range of motion.  Cardiovascular:  Elevated Blood pressure: 116/92  Respiratory: Effort normal.  Genitourinary:    Genitourinary Comments: Deferred   Musculoskeletal: Normal range of motion.  Neurological: She is alert and oriented to person, place, and time.  Skin: Skin is warm.    Review  of Systems  Constitutional: Negative for chills and fever.  Respiratory: Negative for cough, shortness of breath and wheezing.   Cardiovascular: Negative for chest pain and palpitations.  Gastrointestinal: Negative for abdominal pain, heartburn, nausea and vomiting.  Skin: Negative.   Neurological: Negative for dizziness and headaches.  Psychiatric/Behavioral: Positive for depression (Stabilized with medication prior to discharge) and substance abuse (Hx. Alcohol & Methamphetamine use disorders). Negative for hallucinations, memory loss and suicidal ideas. The patient is not nervous/anxious (Stable). Insomnia:  Stabilized with medication prior to  discharge.     Blood pressure (!) 116/92, pulse 85, temperature 98.3 F (36.8 C), resp. rate 18, height  (1.575 m), weight 64.1 kg, SpO2 100 %.Body mass index is 25.84 kg/m.  See Md's discharge SRA   Has this patient used any form of tobacco in the last 30 days? (Cigarettes, Smokeless Tobacco, Cigars, and/or Pipes): N/A  Blood Alcohol level:  No results found for: [facility]  Metabolic Disorder Labs:  No results found for: HGBA1C, MPG No results found for: PROLACTIN No results found for: CHOL, TRIG, HDL, CHOLHDL, VLDL, LDLCALC  See Psychiatric Specialty Exam and Suicide Risk Assessment completed by Attending Physician prior to discharge.  Discharge destination:  Home  Is patient on multiple antipsychotic therapies at discharge:  No   Has Patient had three or more failed trials of antipsychotic monotherapy by history:  No  Recommended Plan for Multiple Antipsychotic Therapies: NA  Allergies as of 08/21/2019   No Known Allergies     Medication List    TAKE these medications     Indication  hydrOXYzine 25 MG tablet Commonly known as: ATARAX/VISTARIL Take 1 tablet (25 mg total) by mouth every 6 (six) hours as needed. For anxiety  Indication: Feeling Anxious   sertraline 50 MG tablet Commonly known as: ZOLOFT Take 1 tablet (50 mg total) by mouth daily. For depression  Indication: Major Depressive Disorder   traZODone 50 MG tablet Commonly known as: DESYREL Take 1 tablet (50 mg total) by mouth at bedtime as needed for sleep.  Indication: Trouble Sleeping      Follow-up [employer], [facility] Follow up on 08/22/2019.   Why: Your hospital follow appointment for medication management and therapy services is Thursday, 08/22/2019 at 9:30am. Please be sure to bring your Photo ID, SSN, any insurance information and proof of household income. For any questions, please call agency.  Contact information: [address] [place] [address] [phone]         Healthcare, [facility]. Schedule an appointment as soon as possible for a visit.   Specialty: Family Medicine Why: Referral has been made for medical follow up regarding hypothyroidism and other medical concerns. Office requires individual to call and complete intake requirements to establish services. For any questions, please call number listed.  Contact information: [address] [place] [address] [phone]          Follow-up recommendations:  Activity:  As tolerated Diet: As recommended by your primary care doctor. Keep all scheduled follow-up appointments as recommended.  Comments: Prescriptions given at discharge.  Patient agreeable to plan.  Given opportunity to ask questions.  Appears to feel comfortable with discharge denies any current suicidal or homicidal thought. Patient is also instructed prior to discharge to: Take all medications as prescribed by his/her mental healthcare provider. Report any adverse effects and or reactions from the medicines to his/her outpatient provider promptly. Patient has been instructed & cautioned: To not  engage in alcohol and or illegal drug use while on prescription medicines. In the event of worsening symptoms, patient is instructed to call the crisis hotline, 911 and or go to the nearest ED for appropriate evaluation and treatment of symptoms. To follow-up with his/her primary care provider for your other medical issues, concerns and or health care needs.  Signed: Armandina Stammer, NP, PMHNP, FNP-BC 08/21/2019, 3:07 PM

## 2019-08-21 NOTE — Progress Notes (Signed)
Patient ID: Erica Lee, female   DOB: [birth year], 28 y.o.   MRN: [id number]   [place] NOVEL CORONAVIRUS (COVID-19) DAILY CHECK-OFF SYMPTOMS - answer yes or no to each - every day NO YES  Have you had a fever in the past 24 hours?  . Fever (Temp > 37.80C / 100F) X   Have you had any of these symptoms in the past 24 hours? . New Cough .  Sore Throat  .  Shortness of Breath .  Difficulty Breathing .  Unexplained Body Aches   X   Have you had any one of these symptoms in the past 24 hours not related to allergies?   . Runny Nose .  Nasal Congestion .  Sneezing   X   If you have had runny nose, nasal congestion, sneezing in the past 24 hours, has it worsened?  X   EXPOSURES - check yes or no X   Have you traveled outside the state in the past 14 days?  X   Have you been in contact with someone with a confirmed diagnosis of COVID-19 or PUI in the past 14 days without wearing appropriate PPE?  X   Have you been living in the same home as a person with confirmed diagnosis of COVID-19 or a PUI (household contact)?    X   Have you been diagnosed with COVID-19?    X              What to do next: Answered NO to all: Answered YES to anything:   Proceed with unit schedule Follow the [facility] Inpatient Flowsheet.

## 2019-08-21 NOTE — Progress Notes (Signed)
   08/21/19 0018  Psych Admission Type (Psych Patients Only)  Admission Status Voluntary  Psychosocial Assessment  Patient Complaints None  Eye Contact Brief  Facial Expression Anxious;Flat  Affect Appropriate to circumstance  Speech Soft;Slow  Interaction Assertive  Motor Activity Fidgety  Appearance/Hygiene Disheveled  Behavior Characteristics Cooperative  Mood Pleasant  Thought Process  Coherency WDL  Content WDL  Delusions None reported or observed  Perception WDL  Hallucination None reported or observed  Judgment Poor  Confusion Mild  Danger to Self  Current suicidal ideation? Denies  Danger to Others  Danger to Others None reported or observed

## 2019-08-21 NOTE — Progress Notes (Signed)
  [facility] Adult Case Management Discharge Plan :  Will you be returning to the same living situation after discharge:  Yes,  patient is returning home with her family At discharge, do you have transportation home?: Yes,  with the patient's parents will pick up Do you have the ability to pay for your medications: Yes,  Medicaid  Release of information consent forms completed and in the chart;  Patient's signature needed at discharge.  Patient to Follow up at: Follow-up [place], [facility] Follow up on 08/22/2019.   Why: Your hospital follow appointment for medication management and therapy services is Thursday, 08/22/2019 at 9:30am. Please be sure to bring your Photo ID, SSN, any insurance information and proof of household income. For any questions, please call agency.  Contact information: [place] [address] [place], [facility]. Schedule an appointment as soon as possible for a visit.   Specialty: Family Medicine Why: Referral has been made for medical follow up regarding hypothyroidism and other medical concerns. Office requires individual to call and complete intake requirements to establish services. For any questions, please call number listed.  Contact information: [place] [address] [phone]           Next level of care provider has access to [place] and Suicide Prevention discussed: Yes,  with the patient's mother     Has patient been referred to the Quitline?: Patient refused referral  Patient has been referred for addiction treatment: Pt. refused referral  Marylee Floras, [place] 08/21/2019, 1:22 PM

## 2019-08-21 NOTE — [facility] Suicide Risk Assessment (Signed)
[facility] INPATIENT:  Family/Significant Other Suicide Prevention Education  Suicide Prevention Education:  Education Completed; with mother, Erica Lee, [phone]) has been identified by the patient as the family member/significant other with whom the patient will be residing, and identified as the person(s) who will aid the patient in the event of a mental health crisis (suicidal ideations/suicide attempt).  With written consent from the patient, the family member/significant other has been provided the following suicide prevention education, prior to the and/or following the discharge of the patient.  The suicide prevention education provided includes the following:  Suicide risk factors  Suicide prevention and interventions  National Suicide Hotline telephone number  [facility] assessment telephone number  [facility] Emergency Assistance [place] and/or Residential Mobile Crisis Unit telephone number  Request made of family/significant other to:  Remove weapons (e.g., guns, rifles, knives), all items previously/currently identified as safety concern.    Remove drugs/medications (over-the-counter, prescriptions, illicit drugs), all items previously/currently identified as a safety concern.  The family member/significant other verbalizes understanding of the suicide prevention education information provided.  The family member/significant other agrees to remove the items of safety concern listed above.  Patient's mother reports she does not have any concerns or questions regarding the patient's discharge today. She reports she is hopeful the patient will continue to be compliant with medications and follow up recommendations. She states that she will be able to pick the patient up this evening, once she gets off of work.   CSW will continue to follow for a safe discharge.     Marylee Floras 08/21/2019, 9:22 AM

## 2021-07-18 IMAGING — US US THYROID
1 series · 13 of 25 positions shown · non-contrast
Comparison: None.

CLINICAL DATA: Other.  Abnormal laboratory tests.

EXAM:
THYROID ULTRASOUND
TECHNIQUE: Ultrasound examination of the thyroid gland and adjacent soft
tissues was performed.

[Series 1: us thyroid · 13 of 93 slices shown]
[im 1/93]
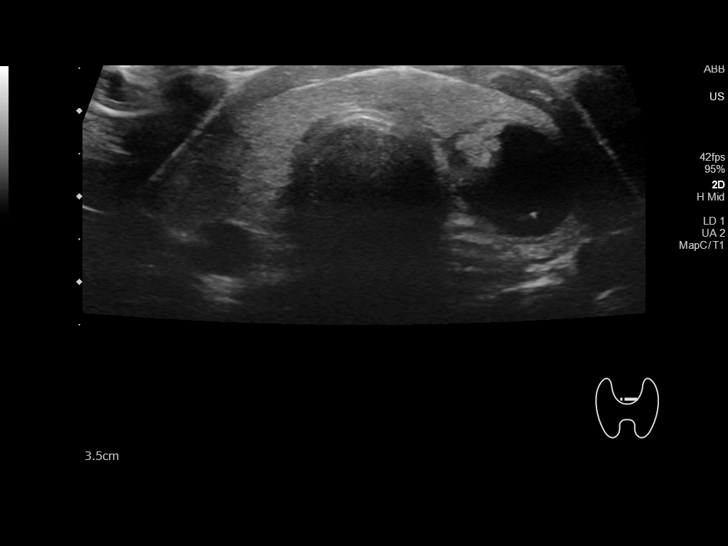
[im 8/93]
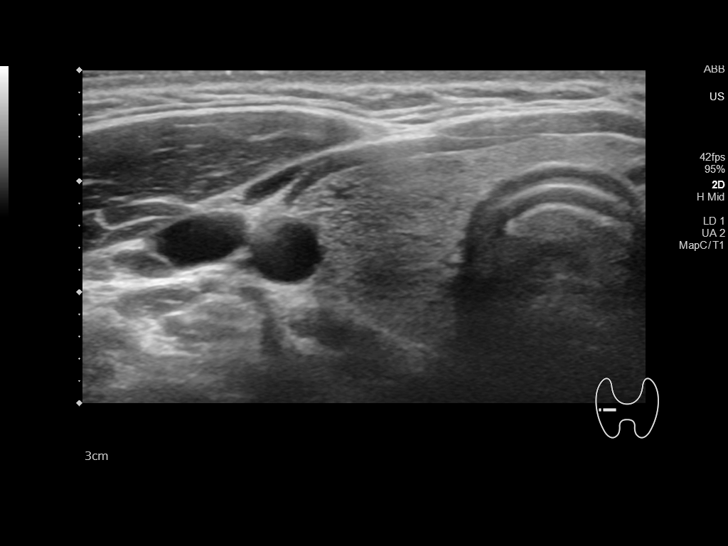
[im 16/93]
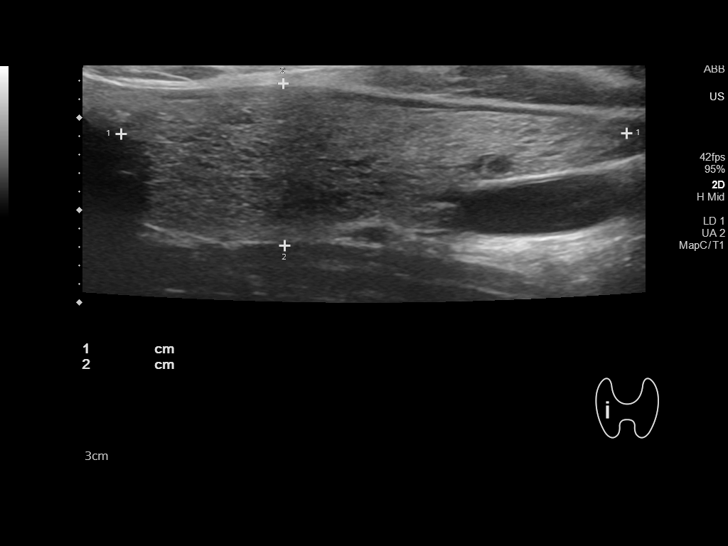
[im 24/93]
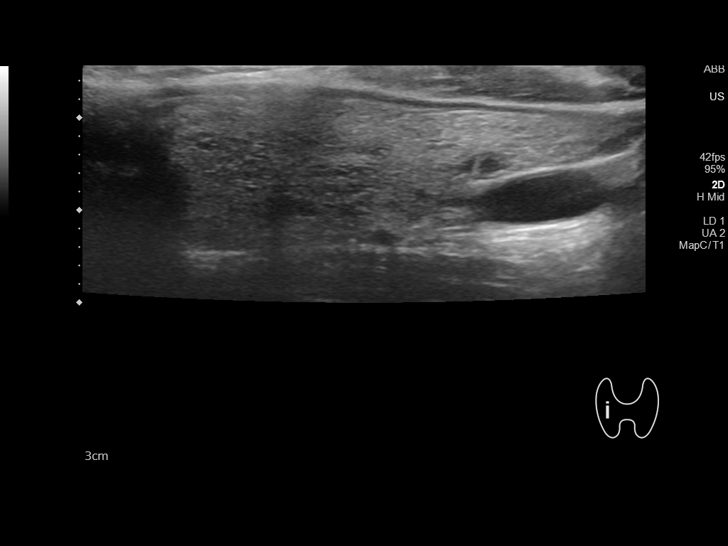
[im 31/93]
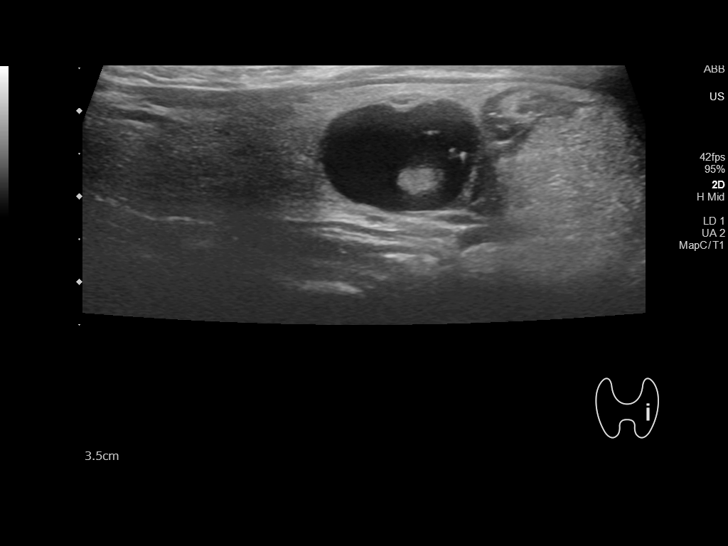
[im 39/93]
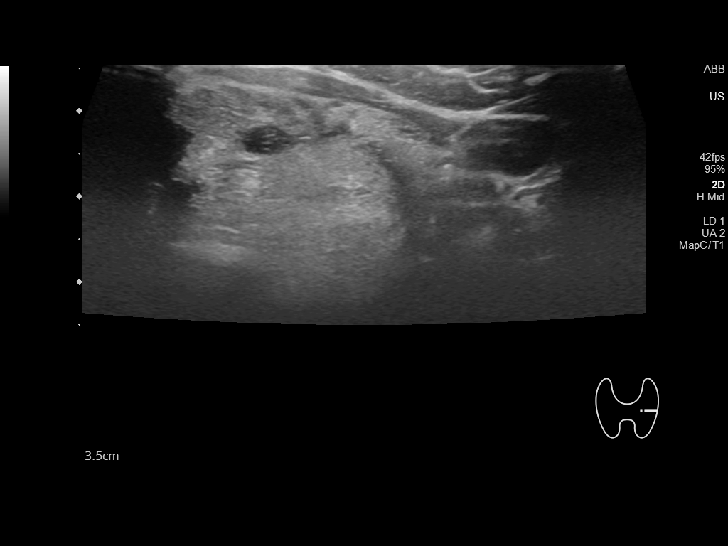
[im 47/93]
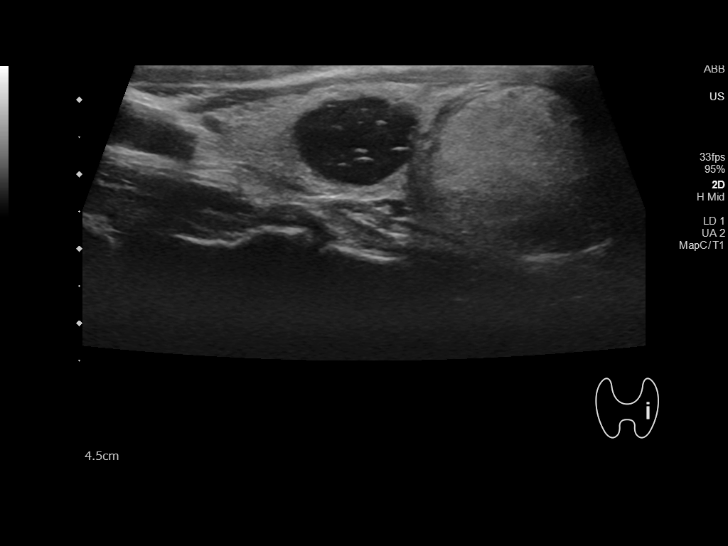
[im 54/93]
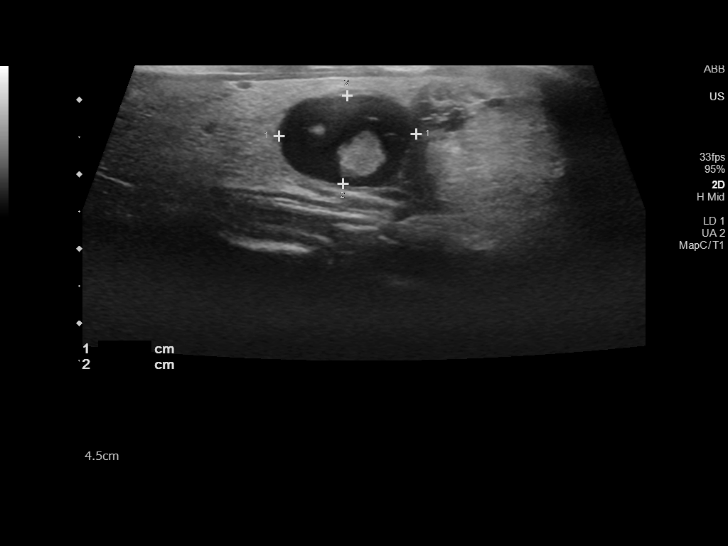
[im 62/93]
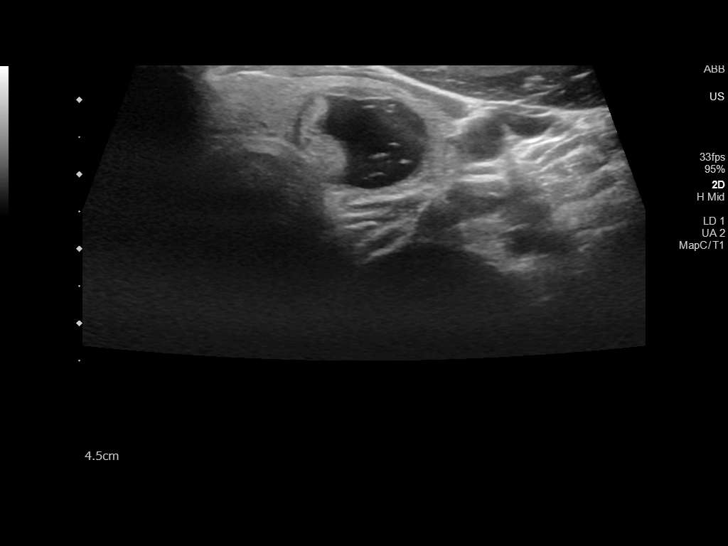
[im 70/93]
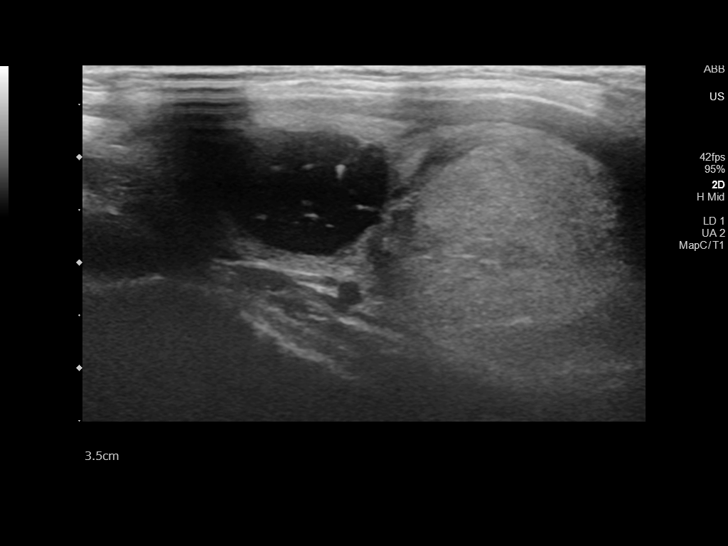
[im 77/93]
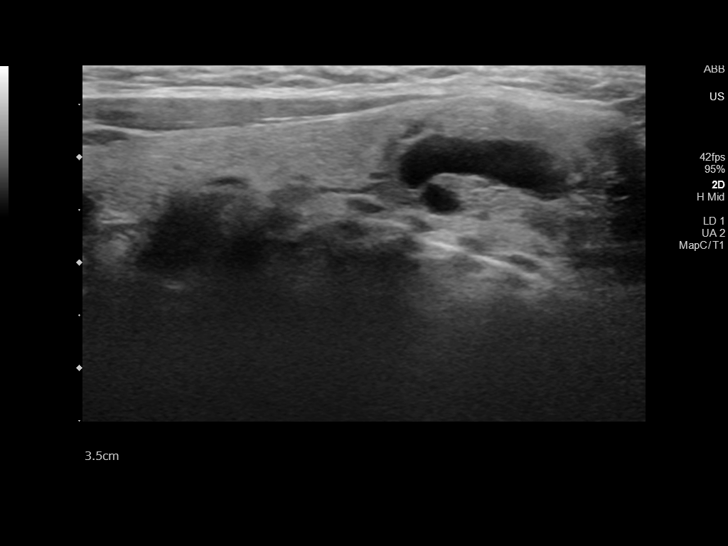
[im 85/93]
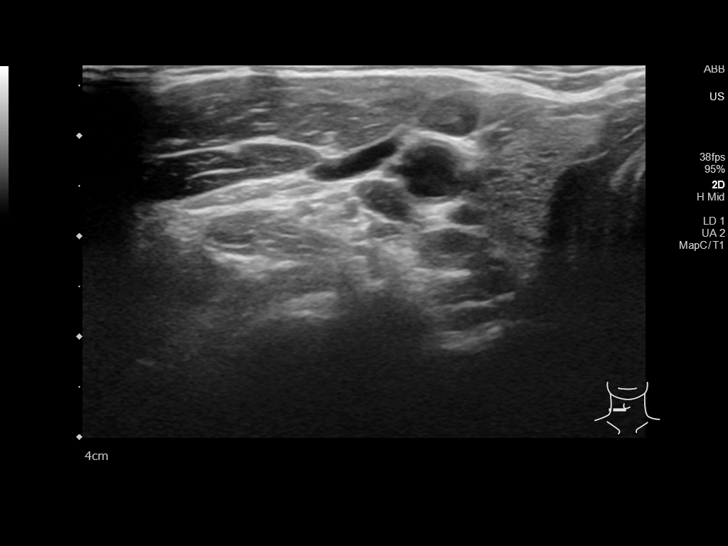
[im 93/93]
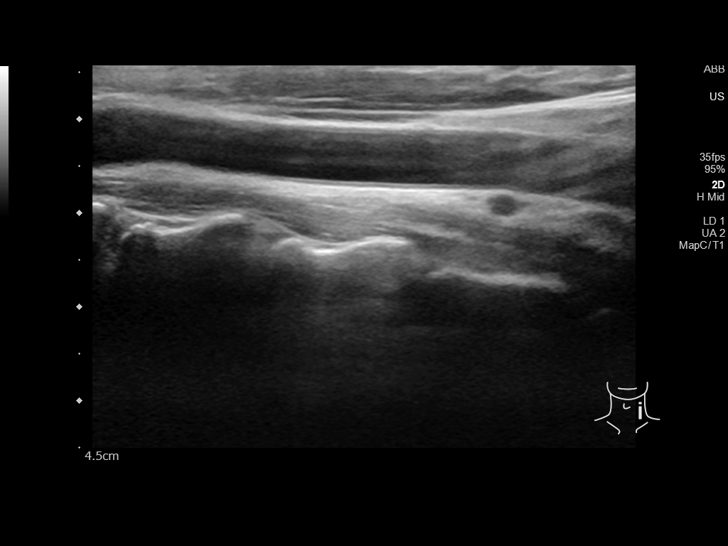

[13 of 25 positions shown; findings below may reference images not displayed]

FINDINGS: Parenchymal Echotexture: Moderately heterogenous

Isthmus: Normal in size measures 0.3 cm in diameter

Right lobe: Normal in size measuring 5.5 x 1.8 x 1.6 cm

Left lobe: Borderline enlarged measuring 6.4 x 1.7 x 1.9 cm

_________________________________________________________

Estimated total number of nodules >/= 1 cm: 3

Number of spongiform nodules >/=  2 cm not described below (TR1): 0

Number of mixed cystic and solid nodules >/= 1.5 cm not described
below (TR2): 0

_________________________________________________________

Nodule # 1:

Location: Left; Mid

Maximum size: 1.8 cm; Other 2 dimensions: 1.8 x 1.2 cm

Composition: cystic/almost completely cystic (0)

Echogenicity: isoechoic (1)

Shape: not taller-than-wide (0)

Margins: smooth (0)

Echogenic foci: large comet-tail artifacts (0)

ACR TI-RADS total points: 1.

ACR TI-RADS risk category: TR1 (0-1 points).

ACR TI-RADS recommendations:

This nodule does NOT meet TI-RADS criteria for biopsy or dedicated
follow-up.

_________________________________________________________

Nodule # 2:

Location: Left; Inferior

Maximum size: 2.5 cm; Other 2 dimensions: 2.2 x 2.1 cm

Composition: solid/almost completely solid (2)

Echogenicity: isoechoic (1)

Shape: not taller-than-wide (0)

Margins: ill-defined (0)

Echogenic foci: none (0)

ACR TI-RADS total points: 3.

ACR TI-RADS risk category: TR3 (3 points).

ACR TI-RADS recommendations:

**Given size (>/= 2.5 cm) and appearance, fine needle aspiration of
this mildly suspicious nodule should be considered based on TI-RADS
criteria.

_________________________________________________________

Nodule # 3:

Location: Isthmus; Mid

Maximum size: 2.2 cm; Other 2 dimensions: 1.9 x 1.4 cm

Composition: mixed cystic and solid (1)

Echogenicity: isoechoic (1)

Shape: not taller-than-wide (0)

Margins: smooth (0)

Echogenic foci: none (0)

ACR TI-RADS total points: 2.

ACR TI-RADS risk category: TR2 (2 points).

ACR TI-RADS recommendations:

This nodule does NOT meet TI-RADS criteria for biopsy or dedicated
follow-up.

_________________________________________________________
IMPRESSION: 1. Borderline enlarged and moderately heterogeneous appearing
thyroid gland, nonspecific though could be seen in the setting of a
thyroiditis.
2. Nodule #2 meets imaging criteria to recommend percutaneous
sampling as clinically indicated.

The above is in keeping with the ACR TI-RADS recommendations - [HOSPITAL] 8755;[DATE].

## 2024-06-25 ENCOUNTER — Ambulatory Visit (HOSPITAL_BASED_OUTPATIENT_CLINIC_OR_DEPARTMENT_OTHER)
Admission: EM | Admit: 2024-06-25 | Discharge: 2024-06-25 | Disposition: A | Discharge status: Home / Self Care | Attending: Family Medicine | Admitting: Family Medicine

## 2024-06-25 ENCOUNTER — Encounter (HOSPITAL_BASED_OUTPATIENT_CLINIC_OR_DEPARTMENT_OTHER): Payer: Self-pay

## 2024-06-25 ENCOUNTER — Ambulatory Visit (INDEPENDENT_AMBULATORY_CARE_PROVIDER_SITE_OTHER): Admit: 2024-06-25 | Discharge: 2024-06-25 | Disposition: A | Discharge status: Home / Self Care | Admitting: Radiology

## 2024-06-25 DIAGNOSIS — M25562 Pain in left knee: Secondary | ICD-10-CM | POA: Diagnosis not present

## 2024-06-25 MED ORDER — PREDNISONE 10 MG (21) PO TBPK: ORAL_TABLET | ORAL | 0 refills | Status: AC

## 2024-06-25 NOTE — ED Triage Notes (Signed)
 Left knee pain x 5 days. No injury. States she notices it most when she gets up from sitting position. States has noticed swelling. Patient wearing tight jeans on arrival so difficult to assess during triage.

## 2024-06-25 NOTE — Discharge Instructions (Signed)
 I believe that your pain is related to some patella tendinitis and arthritis.  Recommend rest, ice, elevate the leg is much as possible.  Would recommend getting a knee sleeve over-the-counter that can help with compression. I am prescribing a prednisone pack for you to take.  Start this in the morning.  Take this with food. If your symptoms do not resolve you will need to follow-up with orthopedic.

## 2024-06-27 NOTE — ED Provider Notes (Signed)
 [facility]    CSN: [id number] Arrival date & time: 06/25/24  1859      History   Chief Complaint Chief Complaint  Patient presents with   Knee Pain    HPI Erica Lee is a 33 y.o. female.   Pt is a 33 year old female that presents with left knee pain x 5 days. No injury. States she notices it most when she gets up from sitting position. States has noticed swelling.     Knee Pain   Past Medical History:  Diagnosis Date   Anxiety    Depression     Patient Active Problem List   Diagnosis Date Noted   Substance induced mood disorder (HCC)    Hyperthyroidism    MDD (major depressive disorder) 08/17/2019    History reviewed. No pertinent surgical history.  OB History   No obstetric history on file.      Home Medications    Prior to Admission medications   Medication Sig Start Date End Date Taking? Authorizing Provider  predniSONE (STERAPRED UNI-PAK 21 TAB) 10 MG (21) TBPK tablet Take as dosed on pack 06/25/24  Yes Stella Bortle A, FNP    Family History History reviewed. No pertinent family history.  Social History Social History   Tobacco Use   Smoking status: Every Day    Current packs/day: 0.50    Types: Cigarettes   Smokeless tobacco: Never  Substance Use Topics   Alcohol use: Yes    Comment: 4-6 cans of beer on weekends   Drug use: Yes    Types: Methamphetamines, Marijuana    Comment: patient is evasive concerning use of methamphetamines     Allergies   Patient has no known allergies.   Review of Systems Review of Systems  See HPI Physical Exam Triage Vital Signs ED Triage Vitals  Encounter Vitals Group     BP 06/25/24 1931 (!) 139/92     Girls Systolic BP Percentile --      Girls Diastolic BP Percentile --      Boys Systolic BP Percentile --      Boys Diastolic BP Percentile --      Pulse Rate 06/25/24 1931 88     Resp 06/25/24 1931 20     Temp 06/25/24 1931 98.6 F (37 C)     Temp Source 06/25/24 1931 Oral      SpO2 06/25/24 1931 98 %     Weight --      Height --      Head Circumference --      Peak Flow --      Pain Score 06/25/24 1933 8     Pain Loc --      Pain Education --      Exclude from Growth Chart --    No data found.  Updated Vital Signs BP (!) 139/92 (BP Location: Right Arm)   Pulse 88   Temp 98.6 F (37 C) (Oral)   Resp 20   LMP 06/19/2024   SpO2 98%   Visual Acuity Right Eye Distance:   Left Eye Distance:   Bilateral Distance:    Right Eye Near:   Left Eye Near:    Bilateral Near:     Physical Exam Vitals and nursing note reviewed.  Constitutional:      General: She is not in acute distress.    Appearance: Normal appearance. She is not ill-appearing, toxic-appearing or diaphoretic.  Pulmonary:     Effort: Pulmonary effort  is normal.  Musculoskeletal:     Left knee: Swelling present. Decreased range of motion. Tenderness present.  Neurological:     Mental Status: She is alert.  Psychiatric:        Mood and Affect: Mood normal.      UC Treatments / Results  Labs (all labs ordered are listed, but only abnormal results are displayed) Labs Reviewed - No data to display  EKG   Radiology DG Knee Complete 4 Views Left Result Date: 06/25/2024 CLINICAL DATA:  Left knee pain for several days, no known injury, initial encounter EXAM: LEFT KNEE - COMPLETE 4+ VIEW COMPARISON:  None Available. FINDINGS: No acute fracture or dislocation is noted. No joint effusion is seen. Minimal patellofemoral degenerative changes are noted. IMPRESSION: No acute abnormality noted. Electronically Signed   By: Oneil Devonshire M.D.   On: 06/25/2024 20:00    Procedures Procedures (including critical care time)  Medications Ordered in UC Medications - No data to display  Initial Impression / Assessment and Plan / UC Course  I have reviewed the triage vital signs and the nursing notes.  Pertinent labs & imaging results that were available during my care of the patient were  reviewed by me and considered in my medical decision making (see chart for details).     Acute left knee pain- Xray with degenerative changes. Most likely patella tendonitis. Treating with prednisone.  Recommend rest, ice, elevate the leg is much as possible.  Would recommend getting a knee sleeve over-the-counter that can help with compression. I am prescribing a prednisone pack for you to take.  Start this in the morning.  Take this with food. If your symptoms do not resolve you will need to follow-up  Final Clinical Impressions(s) / UC Diagnoses   Final diagnoses:  Acute pain of left knee     Discharge Instructions      I believe that your pain is related to some patella tendinitis and arthritis.  Recommend rest, ice, elevate the leg is much as possible.  Would recommend getting a knee sleeve over-the-counter that can help with compression. I am prescribing a prednisone pack for you to take.  Start this in the morning.  Take this with food. If your symptoms do not resolve you will need to follow-up with orthopedic.   ED Prescriptions     Medication Sig Dispense Auth. Provider   predniSONE (STERAPRED UNI-PAK 21 TAB) 10 MG (21) TBPK tablet Take as dosed on pack 1 each Jody Silas A, FNP      PDMP not reviewed this encounter.   Adah Wilbert LABOR, FNP 06/27/24 [phone]
